# Patient Record
Sex: Female | Born: 1995
Health system: Southern US, Community
[De-identification: ages and names within clinical notes are randomized; demographics above are authoritative.]

## PROBLEM LIST (undated history)

## (undated) DIAGNOSIS — R011 Cardiac murmur, unspecified: Secondary | ICD-10-CM

## (undated) DIAGNOSIS — R109 Unspecified abdominal pain: Secondary | ICD-10-CM

## (undated) DIAGNOSIS — T1490XA Injury, unspecified, initial encounter: Secondary | ICD-10-CM

## (undated) DIAGNOSIS — F913 Oppositional defiant disorder: Secondary | ICD-10-CM

## (undated) DIAGNOSIS — F909 Attention-deficit hyperactivity disorder, unspecified type: Secondary | ICD-10-CM

## (undated) DIAGNOSIS — N390 Urinary tract infection, site not specified: Secondary | ICD-10-CM

## (undated) DIAGNOSIS — H9325 Central auditory processing disorder: Secondary | ICD-10-CM

## (undated) DIAGNOSIS — F419 Anxiety disorder, unspecified: Secondary | ICD-10-CM

## (undated) DIAGNOSIS — T7412XA Child physical abuse, confirmed, initial encounter: Secondary | ICD-10-CM

## (undated) DIAGNOSIS — E7800 Pure hypercholesterolemia, unspecified: Secondary | ICD-10-CM

## (undated) DIAGNOSIS — F840 Autistic disorder: Secondary | ICD-10-CM

## (undated) DIAGNOSIS — E785 Hyperlipidemia, unspecified: Secondary | ICD-10-CM

## (undated) DIAGNOSIS — L409 Psoriasis, unspecified: Secondary | ICD-10-CM

## (undated) DIAGNOSIS — R51 Headache: Secondary | ICD-10-CM

## (undated) HISTORY — DX: Injury, unspecified, initial encounter: T14.90XA

## (undated) HISTORY — DX: Headache: R51

## (undated) HISTORY — DX: Unspecified abdominal pain: R10.9

## (undated) HISTORY — DX: Autistic disorder: F84.0

## (undated) HISTORY — DX: Psoriasis, unspecified: L40.9

## (undated) HISTORY — DX: Child physical abuse, confirmed, initial encounter: T74.12XA

## (undated) HISTORY — DX: Oppositional defiant disorder: F91.3

## (undated) HISTORY — DX: Cardiac murmur, unspecified: R01.1

## (undated) HISTORY — DX: Urinary tract infection, site not specified: N39.0

## (undated) HISTORY — PX: OTHER SURGICAL HISTORY: SHX169

## (undated) HISTORY — DX: Anxiety disorder, unspecified: F41.9

## (undated) HISTORY — DX: Central auditory processing disorder: H93.25

## (undated) HISTORY — DX: Attention-deficit hyperactivity disorder, unspecified type: F90.9

## (undated) HISTORY — DX: Pure hypercholesterolemia, unspecified: E78.00

---
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
## 2016-01-27 ENCOUNTER — Telehealth: Payer: Self-pay | Admitting: *Deleted

## 2016-01-27 NOTE — Telephone Encounter (Signed)
Spoke to guardian, cathy, she stated pt was declared incompetent and  [employer] has guardianship.  Barnie AldermanMelissa Price is her SW , I called and LMVM for her , Efraim KaufmannMelissa to call me back to reschedule pts appt.  I cancelled the appt for 02-02-16 at 1630.

## 2016-02-02 ENCOUNTER — Ambulatory Visit: Payer: Medicaid Other | Admitting: Nurse Practitioner

## 2016-02-16 ENCOUNTER — Encounter: Payer: Self-pay | Admitting: Nurse Practitioner

## 2016-02-16 ENCOUNTER — Ambulatory Visit (INDEPENDENT_AMBULATORY_CARE_PROVIDER_SITE_OTHER): Payer: Medicaid Other | Admitting: Nurse Practitioner

## 2016-02-16 VITALS — BP 90/62 | HR 87 | Ht 60.0 in | Wt 124.2 lb

## 2016-02-16 DIAGNOSIS — G43709 Chronic migraine without aura, not intractable, without status migrainosus: Secondary | ICD-10-CM

## 2016-02-16 DIAGNOSIS — IMO0002 Reserved for concepts with insufficient information to code with codable children: Secondary | ICD-10-CM
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
## 2016-02-16 NOTE — Patient Instructions (Signed)
Headaches well-controlled  given information on foods that cause migraines  Follow-up when necessary only

## 2016-02-17 NOTE — Progress Notes (Signed)
I have reviewed and agreed above plan. 

## 2016-11-27 ENCOUNTER — Ambulatory Visit (INDEPENDENT_AMBULATORY_CARE_PROVIDER_SITE_OTHER): Payer: Medicaid Other | Admitting: Neurology

## 2016-11-27 VITALS — BP 90/48 | HR 88 | Ht 63.5 in | Wt 137.0 lb

## 2016-11-27 DIAGNOSIS — IMO0002 Reserved for concepts with insufficient information to code with codable children: Secondary | ICD-10-CM

## 2016-11-27 DIAGNOSIS — G43709 Chronic migraine without aura, not intractable, without status migrainosus: Secondary | ICD-10-CM | POA: Diagnosis not present

## 2016-11-27 DIAGNOSIS — R4689 Other symptoms and signs involving appearance and behavior: Secondary | ICD-10-CM

## 2016-11-27 DIAGNOSIS — R4189 Other symptoms and signs involving cognitive functions and awareness: Secondary | ICD-10-CM | POA: Diagnosis not present

## 2016-11-27 MED ORDER — GABAPENTIN 100 MG PO CAPS: ORAL_CAPSULE | ORAL | 6 refills | Status: DC

## 2016-11-27 MED ORDER — RIZATRIPTAN BENZOATE 5 MG PO TABS: 5.0000 mg | ORAL_TABLET | ORAL | 6 refills | Status: DC | PRN
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
## 2016-11-27 NOTE — Patient Instructions (Signed)
1. Start gabapentin 100mg : Take 1 capsule every night 2. Take Maxalt 5mg  at onset of migraine, do not take more than 2-3 a week 3. Schedule Neuropsychological evaluation with Dr. Alinda DoomsBailar 4. Follow-up in 4-5 months, call for any changes

## 2016-12-01 ENCOUNTER — Encounter: Payer: Self-pay | Admitting: Neurology

## 2016-12-26 ENCOUNTER — Encounter: Payer: Self-pay | Admitting: Psychology

## 2017-02-27 ENCOUNTER — Ambulatory Visit (INDEPENDENT_AMBULATORY_CARE_PROVIDER_SITE_OTHER): Payer: Medicaid Other | Admitting: Psychology

## 2017-02-27 ENCOUNTER — Encounter: Payer: Self-pay | Admitting: Psychology

## 2017-02-27 DIAGNOSIS — F840 Autistic disorder: Secondary | ICD-10-CM | POA: Diagnosis not present

## 2017-02-27 DIAGNOSIS — R413 Other amnesia: Secondary | ICD-10-CM | POA: Diagnosis not present
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
## 2017-03-06 ENCOUNTER — Ambulatory Visit (INDEPENDENT_AMBULATORY_CARE_PROVIDER_SITE_OTHER): Payer: Medicaid Other | Admitting: Psychology

## 2017-03-06 DIAGNOSIS — R413 Other amnesia: Secondary | ICD-10-CM | POA: Diagnosis not present

## 2017-03-06 DIAGNOSIS — F840 Autistic disorder: Secondary | ICD-10-CM

## 2017-03-06 DIAGNOSIS — R4189 Other symptoms and signs involving cognitive functions and awareness: Secondary | ICD-10-CM

## 2017-03-06 DIAGNOSIS — R4689 Other symptoms and signs involving appearance and behavior: Secondary | ICD-10-CM

## 2017-03-06 NOTE — Progress Notes (Signed)
   Neuropsychology Note  Jennifer Higgins returned today for 2 hours of neuropsychological testing with technician, Wallace Keller, BS, under the supervision of Dr. Elvis Coil. The patient did appear very tired and agitated at the end of the testing session, per behavioral observation or via self-report to the technician. Patient did take 2 rest breaks.  Jennifer Higgins will return within 2 weeks for a feedback session with Dr. Alinda Dooms at which time her test performances, clinical impressions and treatment recommendations will be reviewed in detail. The patient understands she can contact our office should she require our assistance before this time.  Full report to follow.
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
## 2017-03-13 ENCOUNTER — Encounter: Payer: Self-pay | Admitting: Psychology

## 2017-03-13 ENCOUNTER — Ambulatory Visit (INDEPENDENT_AMBULATORY_CARE_PROVIDER_SITE_OTHER): Payer: Medicaid Other | Admitting: Psychology

## 2017-03-13 DIAGNOSIS — F840 Autistic disorder: Secondary | ICD-10-CM | POA: Diagnosis not present

## 2017-10-24 ENCOUNTER — Other Ambulatory Visit: Payer: Self-pay | Admitting: Neurology

## 2017-10-24 DIAGNOSIS — G43709 Chronic migraine without aura, not intractable, without status migrainosus: Secondary | ICD-10-CM

## 2017-10-24 DIAGNOSIS — IMO0002 Reserved for concepts with insufficient information to code with codable children: Secondary | ICD-10-CM

## 2017-11-26 ENCOUNTER — Other Ambulatory Visit: Payer: Self-pay | Admitting: Neurology

## 2017-11-26 DIAGNOSIS — G43709 Chronic migraine without aura, not intractable, without status migrainosus: Secondary | ICD-10-CM

## 2017-11-26 DIAGNOSIS — IMO0002 Reserved for concepts with insufficient information to code with codable children: Secondary | ICD-10-CM

## 2017-12-01 ENCOUNTER — Other Ambulatory Visit: Payer: Self-pay | Admitting: Neurology

## 2017-12-01 DIAGNOSIS — IMO0002 Reserved for concepts with insufficient information to code with codable children: Secondary | ICD-10-CM

## 2017-12-01 DIAGNOSIS — G43709 Chronic migraine without aura, not intractable, without status migrainosus: Secondary | ICD-10-CM

## 2018-07-02 ENCOUNTER — Ambulatory Visit: Payer: Self-pay | Admitting: Cardiovascular Disease

## 2018-07-03 ENCOUNTER — Ambulatory Visit (INDEPENDENT_AMBULATORY_CARE_PROVIDER_SITE_OTHER): Payer: Medicare Other | Admitting: Cardiology

## 2018-07-03 ENCOUNTER — Encounter: Payer: Self-pay | Admitting: Cardiology

## 2018-07-03 VITALS — BP 96/62 | HR 93 | Ht 63.5 in | Wt 142.0 lb

## 2018-07-03 DIAGNOSIS — R079 Chest pain, unspecified: Secondary | ICD-10-CM

## 2018-07-03 DIAGNOSIS — E782 Mixed hyperlipidemia: Secondary | ICD-10-CM

## 2018-07-03 MED ORDER — ATORVASTATIN CALCIUM 20 MG PO TABS: 20.0000 mg | ORAL_TABLET | Freq: Every day | ORAL | 3 refills | Status: DC

## 2018-07-03 NOTE — Patient Instructions (Signed)
Medication Instructions:  Start Lipitor 20 mg daily   Labwork: I will request labs & office notes   Testing/Procedures: none  Follow-Up: Your physician wants you to follow-up in: 6 months .  You will receive a reminder letter in the mail two months in advance. If you don't receive a letter, please call our office to schedule the follow-up appointment.   Any Other Special Instructions Will Be Listed Below (If Applicable).     If you need a refill on your cardiac medications before your next appointment, please call your pharmacy.
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
## 2018-07-30 ENCOUNTER — Other Ambulatory Visit: Payer: Self-pay | Admitting: Neurology

## 2018-08-13 DIAGNOSIS — K5900 Constipation, unspecified: Secondary | ICD-10-CM | POA: Diagnosis not present

## 2018-08-13 DIAGNOSIS — E559 Vitamin D deficiency, unspecified: Secondary | ICD-10-CM | POA: Diagnosis not present

## 2018-08-13 DIAGNOSIS — E785 Hyperlipidemia, unspecified: Secondary | ICD-10-CM | POA: Diagnosis not present

## 2018-08-13 DIAGNOSIS — R079 Chest pain, unspecified: Secondary | ICD-10-CM | POA: Diagnosis not present

## 2018-08-14 DIAGNOSIS — E559 Vitamin D deficiency, unspecified: Secondary | ICD-10-CM | POA: Diagnosis not present

## 2018-08-14 DIAGNOSIS — E785 Hyperlipidemia, unspecified: Secondary | ICD-10-CM | POA: Diagnosis not present

## 2018-08-26 DIAGNOSIS — K5900 Constipation, unspecified: Secondary | ICD-10-CM | POA: Diagnosis not present

## 2018-09-04 ENCOUNTER — Ambulatory Visit (INDEPENDENT_AMBULATORY_CARE_PROVIDER_SITE_OTHER): Payer: Self-pay | Admitting: Internal Medicine

## 2018-09-09 ENCOUNTER — Other Ambulatory Visit: Payer: Self-pay | Admitting: Neurology

## 2018-09-09 DIAGNOSIS — IMO0002 Reserved for concepts with insufficient information to code with codable children: Secondary | ICD-10-CM

## 2018-09-09 DIAGNOSIS — G43709 Chronic migraine without aura, not intractable, without status migrainosus: Secondary | ICD-10-CM

## 2018-10-08 ENCOUNTER — Other Ambulatory Visit: Payer: Self-pay | Admitting: Neurology

## 2018-10-08 DIAGNOSIS — IMO0002 Reserved for concepts with insufficient information to code with codable children: Secondary | ICD-10-CM

## 2018-10-08 DIAGNOSIS — G43709 Chronic migraine without aura, not intractable, without status migrainosus: Secondary | ICD-10-CM

## 2018-10-10 ENCOUNTER — Telehealth: Payer: Self-pay | Admitting: Adult Health
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
## 2018-10-11 NOTE — Telephone Encounter (Signed)
Spoke with [name] from DSS. Pt will need an appt for pap and STD screen. Pt will need a support person with her at appt. Call transferred to front desk for appt. JSY

## 2018-10-16 ENCOUNTER — Other Ambulatory Visit (HOSPITAL_COMMUNITY)
Admission: RE | Admit: 2018-10-16 | Discharge: 2018-10-16 | Disposition: A | Discharge status: Home / Self Care | Payer: Medicare Other | Source: Ambulatory Visit | Attending: Adult Health | Admitting: Adult Health

## 2018-10-16 ENCOUNTER — Other Ambulatory Visit: Payer: Self-pay

## 2018-10-16 ENCOUNTER — Ambulatory Visit (INDEPENDENT_AMBULATORY_CARE_PROVIDER_SITE_OTHER): Payer: Medicare Other | Admitting: Adult Health

## 2018-10-16 ENCOUNTER — Encounter: Payer: Self-pay | Admitting: Adult Health

## 2018-10-16 VITALS — BP 107/76 | HR 97 | Ht 63.5 in | Wt 147.0 lb

## 2018-10-16 DIAGNOSIS — Z01419 Encounter for gynecological examination (general) (routine) without abnormal findings: Secondary | ICD-10-CM

## 2018-10-16 DIAGNOSIS — Z30011 Encounter for initial prescription of contraceptive pills: Secondary | ICD-10-CM

## 2018-10-16 DIAGNOSIS — Z124 Encounter for screening for malignant neoplasm of cervix: Secondary | ICD-10-CM | POA: Diagnosis not present

## 2018-10-16 DIAGNOSIS — Z0001 Encounter for general adult medical examination with abnormal findings: Secondary | ICD-10-CM

## 2018-10-16 DIAGNOSIS — N946 Dysmenorrhea, unspecified: Secondary | ICD-10-CM

## 2018-10-16 DIAGNOSIS — N920 Excessive and frequent menstruation with regular cycle: Secondary | ICD-10-CM | POA: Insufficient documentation

## 2018-10-16 HISTORY — DX: Encounter for general adult medical examination with abnormal findings: Z00.01

## 2018-10-16 MED ORDER — LEVONORGEST-ETH ESTRAD 91-DAY 0.15-0.03 &0.01 MG PO TABS: 1.0000 | ORAL_TABLET | Freq: Every day | ORAL | 4 refills | Status: DC
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
## 2018-10-18 LAB — CYTOLOGY - PAP
Adequacy: ABSENT
Chlamydia: NEGATIVE
Diagnosis: NEGATIVE
Neisseria Gonorrhea: NEGATIVE

## 2018-11-06 DIAGNOSIS — K5900 Constipation, unspecified: Secondary | ICD-10-CM | POA: Diagnosis not present

## 2018-11-06 DIAGNOSIS — F330 Major depressive disorder, recurrent, mild: Secondary | ICD-10-CM | POA: Diagnosis not present

## 2018-11-06 DIAGNOSIS — E785 Hyperlipidemia, unspecified: Secondary | ICD-10-CM | POA: Diagnosis not present

## 2018-11-13 ENCOUNTER — Ambulatory Visit (INDEPENDENT_AMBULATORY_CARE_PROVIDER_SITE_OTHER): Payer: Self-pay | Admitting: Nurse Practitioner

## 2018-12-05 ENCOUNTER — Telehealth: Payer: Self-pay | Admitting: Cardiology

## 2018-12-05 NOTE — Telephone Encounter (Signed)
[name] case worker [name] called to schedule pt her next f/u apt.[name] stated that Dr. Anastasio Champion changed the pt's Lipitor dose due to lab work results.   I have scanned those in from Dr. Anastasio Champion and forwarded them to Dr. Harl Bowie.

## 2018-12-11 ENCOUNTER — Encounter: Payer: Self-pay | Admitting: Neurology

## 2018-12-11 ENCOUNTER — Other Ambulatory Visit: Payer: Self-pay

## 2018-12-11 ENCOUNTER — Ambulatory Visit (INDEPENDENT_AMBULATORY_CARE_PROVIDER_SITE_OTHER): Payer: Medicare Other | Admitting: Neurology

## 2018-12-11 DIAGNOSIS — G43709 Chronic migraine without aura, not intractable, without status migrainosus: Secondary | ICD-10-CM

## 2018-12-11 DIAGNOSIS — IMO0002 Reserved for concepts with insufficient information to code with codable children: Secondary | ICD-10-CM

## 2018-12-11 MED ORDER — RIZATRIPTAN BENZOATE 5 MG PO TABS: ORAL_TABLET | ORAL | 11 refills | Status: DC

## 2018-12-11 MED ORDER — GABAPENTIN 100 MG PO CAPS: ORAL_CAPSULE | ORAL | 3 refills | Status: DC
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
## 2018-12-11 NOTE — Patient Instructions (Signed)
1. Restart Gabapentin 100mg : take 1 capsule every night  2. Take Maxalt as needed for headache. Do not take more than 2-3 a week  3. Follow-up in 6-68months, call for any changes

## 2019-01-16 ENCOUNTER — Ambulatory Visit: Payer: Medicare Other | Admitting: Adult Health

## 2019-01-17 ENCOUNTER — Ambulatory Visit: Payer: Medicare Other | Admitting: Women's Health

## 2019-01-20 ENCOUNTER — Telehealth: Payer: Self-pay | Admitting: Women's Health

## 2019-01-20 NOTE — Telephone Encounter (Signed)

## 2019-01-21 ENCOUNTER — Other Ambulatory Visit: Payer: Self-pay

## 2019-01-21 ENCOUNTER — Ambulatory Visit (INDEPENDENT_AMBULATORY_CARE_PROVIDER_SITE_OTHER): Payer: Medicare Other | Admitting: Women's Health

## 2019-01-21 ENCOUNTER — Encounter: Payer: Self-pay | Admitting: Women's Health

## 2019-01-21 VITALS — Ht 65.0 in

## 2019-01-21 DIAGNOSIS — N920 Excessive and frequent menstruation with regular cycle: Secondary | ICD-10-CM | POA: Diagnosis not present

## 2019-01-21 DIAGNOSIS — Z3041 Encounter for surveillance of contraceptive pills: Secondary | ICD-10-CM

## 2019-01-21 DIAGNOSIS — N946 Dysmenorrhea, unspecified: Secondary | ICD-10-CM | POA: Diagnosis not present

## 2019-01-21 NOTE — Progress Notes (Signed)
   [place] VIRTUAL GYN VISIT ENCOUNTER NOTE Patient name: Jennifer Higgins MRN [id number]  Date of birth: 1995/08/13  I connected with patient on 01/21/19 at 11:30 AM EDT by webex and verified that I am speaking with the correct person using two identifiers.  Due to COVID-19 recommendations, pt is not currently in the office.    I discussed the limitations, risks, security and privacy concerns of performing an evaluation and management service by telephone and the availability of in person appointments. I also discussed with the patient that there may be a patient responsible charge related to this service. The patient expressed understanding and agreed to proceed.   Chief Complaint:   Follow-up (on birth control)  History of Present Illness:   Jennifer Higgins is a 23 y.o. G0P0000 Caucasian female being evaluated today for f/u on COCs.  Rx'd levonorgestrel-EE 0.15-0.03 & 0.01mg  COCs on 10/16/18 by JAG for heavy painful periods. Periods have improved some, still bad cramps, wants to give it few more months to see if it will help.   No LMP recorded. The current method of family planning is OCP (estrogen/progesterone).  Last pap 10/16/18. Results were:  normal Review of Systems:   Pertinent items are noted in HPI Denies fever/chills, dizziness, headaches, visual disturbances, fatigue, shortness of breath, chest pain, abdominal pain, vomiting, abnormal vaginal discharge/itching/odor/irritation, problems with periods, bowel movements, urination, or intercourse unless otherwise stated above.  Pertinent History Reviewed:  Reviewed past medical,surgical, social, obstetrical and family history.  Reviewed problem list, medications and allergies. Physical Assessment:   Vitals:   01/21/19 1059  Height: 5\' 5"  (1.651 m)  Body mass index is 25.63 kg/m.       Physical Examination:   General:  Alert, oriented and cooperative.   Mental Status: Normal mood and affect perceived. Normal judgment and  thought content.  Physical exam deferred due to nature of the encounter  No results found for this or any previous visit (from the past 24 hour(s)).  Assessment & Plan:  1) Contraception f/u> for heavy/painful periods, give COCs another 63mths to see if dysmenorrhea improves, f/u 65mths  Meds: No orders of the defined types were placed in this encounter.   No orders of the defined types were placed in this encounter.   I discussed the assessment and treatment plan with the patient. The patient was provided an opportunity to ask questions and all were answered. The patient agreed with the plan and demonstrated an understanding of the instructions.   The patient was advised to call back or seek an in-person evaluation/go to the ED if the symptoms worsen or if the condition fails to improve as anticipated.  I provided 10 minutes of non-face-to-face time during this encounter.   Return in about 3 months (around 04/23/2019) for F/U, MyChart Video.  [place], [facility] 01/21/2019 11:21 AM

## 2019-02-20 ENCOUNTER — Telehealth (INDEPENDENT_AMBULATORY_CARE_PROVIDER_SITE_OTHER): Payer: Medicare Other | Admitting: Cardiology

## 2019-02-20 ENCOUNTER — Encounter: Payer: Self-pay | Admitting: Cardiology

## 2019-02-20 ENCOUNTER — Telehealth: Payer: Self-pay | Admitting: Licensed Clinical Social Worker

## 2019-02-20 VITALS — Ht 62.0 in | Wt 159.0 lb

## 2019-02-20 DIAGNOSIS — E782 Mixed hyperlipidemia: Secondary | ICD-10-CM | POA: Diagnosis not present

## 2019-02-20 NOTE — Progress Notes (Signed)
Please send last labs from [facility] panel. THX!
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
## 2019-02-20 NOTE — Telephone Encounter (Signed)
CSW referred to assist patient with obtaining a BP cuff. CSW contacted patient's legal guardian at [place] to inform cuff will be delivered to home. Guardian grateful for support and assistance. CSW available as needed. Raquel Sarna, [place], [place]

## 2019-02-20 NOTE — Progress Notes (Signed)
Please send labs-I hope this is not a repeat!

## 2019-02-20 NOTE — Patient Instructions (Signed)

## 2019-02-24 ENCOUNTER — Ambulatory Visit (INDEPENDENT_AMBULATORY_CARE_PROVIDER_SITE_OTHER): Payer: Medicare Other | Admitting: Internal Medicine

## 2019-02-24 ENCOUNTER — Encounter (INDEPENDENT_AMBULATORY_CARE_PROVIDER_SITE_OTHER): Payer: Self-pay | Admitting: Internal Medicine

## 2019-02-24 ENCOUNTER — Other Ambulatory Visit: Payer: Self-pay

## 2019-02-24 VITALS — BP 120/80 | HR 72 | Ht 64.0 in | Wt 164.6 lb

## 2019-02-24 DIAGNOSIS — E785 Hyperlipidemia, unspecified: Secondary | ICD-10-CM | POA: Insufficient documentation

## 2019-02-24 DIAGNOSIS — F902 Attention-deficit hyperactivity disorder, combined type: Secondary | ICD-10-CM

## 2019-02-24 DIAGNOSIS — E559 Vitamin D deficiency, unspecified: Secondary | ICD-10-CM | POA: Diagnosis not present

## 2019-02-24 DIAGNOSIS — E782 Mixed hyperlipidemia: Secondary | ICD-10-CM

## 2019-02-24 HISTORY — DX: Hyperlipidemia, unspecified: E78.5

## 2019-02-24 NOTE — Patient Instructions (Signed)
[facility] Dietary Recommendations for Weight Loss What to Avoid . Avoid added sugars o Often added sugar can be found in processed foods such as many condiments, dry cereals, cakes, cookies, chips, crisps, crackers, candies, sweetened drinks, etc.  o Read labels and AVOID/DECREASE use of foods with the following in their ingredient list: Sugar, fructose, high fructose corn syrup, sucrose, glucose, maltose, dextrose, molasses, cane sugar, brown sugar, any type of syrup, agave nectar, etc.   . Avoid snacking in between meals . Avoid foods made with flour o If you are going to eat food made with flour, choose those made with whole-grains; and, minimize your consumption as much as is tolerable . Avoid processed foods o These foods are generally stocked in the middle of the grocery store. Focus on shopping on the perimeter of the grocery.  What to Include . Vegetables o GREEN LEAFY VEGETABLES: Kale, spinach, mustard greens, collard greens, cabbage, broccoli, etc. o OTHER: Asparagus, cauliflower, eggplant, carrots, peas, Brussel sprouts, tomatoes, bell peppers, zucchini, beets, cucumbers, etc. . Grains, seeds, and legumes o Beans: kidney beans, black eyed peas, garbanzo beans, black beans, pinto beans, etc. o Whole, unrefined grains: brown rice, barley, bulgur, oatmeal, etc. . Healthy fats  o Avoid highly processed fats such as vegetable oil o Examples of healthy fats: avocado, olives, virgin olive oil, dark chocolate (?72% Cocoa), nuts (peanuts, almonds, walnuts, cashews, pecans, etc.) . Low - Moderate Intake of Animal Sources of Protein o Meat sources: chicken, turkey, salmon, tuna. Limit to 4 ounces of meat at one time. o Consider limiting dairy sources, but when choosing dairy focus on: PLAIN Greek yogurt, cottage cheese, high-protein milk . Fruit o Choose berries  When to Eat . Intermittent Fasting: o Choosing not to eat for a specific time period, but DO FOCUS ON HYDRATION  when fasting o Multiple Techniques: - Time Restricted Eating: eat 3 meals in a day, each meal lasting no more than 60 minutes, no snacks between meals - 16-18 hour fast: fast for 16 to 18 hours up to 7 days a week. Often suggested to start with 2-3 nonconsecutive days per week.  . Remember the time you sleep is counted as fasting.  . Examples of eating schedule: Fast from 7:00pm-11:00am. Eat between 11:00am-7:00pm.  - 24-hour fast: fast for 24 hours up to every other day. Often suggested to start with 1 day per week . Remember the time you sleep is counted as fasting . Examples of eating schedule:  o Eating day: eat 2-3 meals on your eating day. If doing 2 meals, each meal should last no more than 90 minutes. If doing 3 meals, each meal should last no more than 60 minutes. Finish last meal by 7:00pm. o Fasting day: Fast until 7:00pm.  o IF YOU FEEL UNWELL FOR ANY REASON/IN ANY WAY WHEN FASTING, STOP FASTING BY EATING A NUTRITIOUS SNACK OR LIGHT MEAL o ALWAYS FOCUS ON HYDRATION DURING FASTS - Acceptable Hydration sources: water, broths, tea/coffee (black tea/coffee is best but using a small amount of whole-fat dairy products in coffee/tea is acceptable).  - Poor Hydration Sources: anything with sugar or artificial sweeteners added to it  These recommendations have been developed for patients that are actively receiving medical care from either Dr.  or Sarah Gray, DNP, NP-C at  [facility]. These recommendations are developed for patients with specific medical conditions and are not meant to be distributed or used by others that are not actively receiving care from either provider listed   above at  [facility]. It is not appropriate to participate in the above eating plans without proper medical supervision.   Reference: Fung, J. The obesity code. [place]: [place]; 2016.   
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
## 2019-02-25 ENCOUNTER — Encounter (INDEPENDENT_AMBULATORY_CARE_PROVIDER_SITE_OTHER): Payer: Self-pay | Admitting: Internal Medicine

## 2019-02-25 LAB — COMPLETE METABOLIC PANEL WITH GFR
AG Ratio: 1.4 (calc) (ref 1.0–2.5)
ALT: 21 U/L (ref 6–29)
AST: 29 U/L (ref 10–30)
Albumin: 4.1 g/dL (ref 3.6–5.1)
Alkaline phosphatase (APISO): 51 U/L (ref 31–125)
BUN: 9 mg/dL (ref 7–25)
CO2: 22 mmol/L (ref 20–32)
Calcium: 9.2 mg/dL (ref 8.6–10.2)
Chloride: 105 mmol/L (ref 98–110)
Creat: 0.8 mg/dL (ref 0.50–1.10)
GFR, Est African American: 122 mL/min/{1.73_m2} (ref >=60)
GFR, Est Non African American: 105 mL/min/{1.73_m2} (ref >=60)
Globulin: 2.9 g/dL (calc) (ref 1.9–3.7)
Glucose, Bld: 84 mg/dL (ref 65–99)
Potassium: 4.4 mmol/L (ref 3.5–5.3)
Sodium: 139 mmol/L (ref 135–146)
Total Bilirubin: 0.3 mg/dL (ref 0.2–1.2)
Total Protein: 7 g/dL (ref 6.1–8.1)

## 2019-02-25 LAB — LIPID PANEL
Cholesterol: 223 mg/dL — ABNORMAL HIGH (ref <200)
HDL: 45 mg/dL — ABNORMAL LOW (ref >=50)
LDL Cholesterol (Calc): 133 mg/dL (calc) — ABNORMAL HIGH
Non-HDL Cholesterol (Calc): 178 mg/dL (calc) — ABNORMAL HIGH (ref <130)
Total CHOL/HDL Ratio: 5 (calc) — ABNORMAL HIGH (ref <5.0)
Triglycerides: 312 mg/dL — ABNORMAL HIGH (ref <150)

## 2019-02-25 LAB — VITAMIN D 25 HYDROXY (VIT D DEFICIENCY, FRACTURES): Vit D, 25-Hydroxy: 25 ng/mL — ABNORMAL LOW (ref 30–100)

## 2019-02-25 NOTE — Progress Notes (Signed)
Please see this patient's cholesterol numbers.  I think they are reasonable to continue with the same dose of Lipitor.  She has not been very good with diet so I encouraged her to do this.

## 2019-03-11 ENCOUNTER — Other Ambulatory Visit (INDEPENDENT_AMBULATORY_CARE_PROVIDER_SITE_OTHER): Payer: Self-pay | Admitting: Nurse Practitioner

## 2019-03-17 ENCOUNTER — Telehealth (INDEPENDENT_AMBULATORY_CARE_PROVIDER_SITE_OTHER): Payer: Self-pay | Admitting: Internal Medicine

## 2019-03-17 ENCOUNTER — Other Ambulatory Visit (INDEPENDENT_AMBULATORY_CARE_PROVIDER_SITE_OTHER): Payer: Self-pay | Admitting: Internal Medicine

## 2019-03-17 MED ORDER — CLONIDINE HCL 0.1 MG PO TABS: 0.1000 mg | ORAL_TABLET | Freq: Every day | ORAL | 1 refills | Status: DC

## 2019-03-18 NOTE — Telephone Encounter (Signed)
done

## 2019-04-08 ENCOUNTER — Encounter (INDEPENDENT_AMBULATORY_CARE_PROVIDER_SITE_OTHER): Payer: Self-pay | Admitting: Internal Medicine

## 2019-04-08 ENCOUNTER — Ambulatory Visit (INDEPENDENT_AMBULATORY_CARE_PROVIDER_SITE_OTHER): Payer: Medicare Other | Admitting: Internal Medicine

## 2019-04-08 ENCOUNTER — Other Ambulatory Visit: Payer: Self-pay

## 2019-04-08 VITALS — BP 110/70 | HR 72 | Ht 64.0 in | Wt 162.0 lb

## 2019-04-08 DIAGNOSIS — M79601 Pain in right arm: Secondary | ICD-10-CM | POA: Diagnosis not present

## 2019-04-08 DIAGNOSIS — Z23 Encounter for immunization: Secondary | ICD-10-CM
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
## 2019-04-22 ENCOUNTER — Telehealth: Payer: Self-pay | Admitting: Medical

## 2019-04-22 NOTE — Telephone Encounter (Signed)
Unable to reach pt - VM full

## 2019-04-23 ENCOUNTER — Ambulatory Visit: Payer: Medicare Other | Admitting: Medical

## 2019-04-29 ENCOUNTER — Telehealth: Payer: Self-pay | Admitting: Advanced Practice Midwife

## 2019-04-29 NOTE — Telephone Encounter (Signed)
Tried to reach the patient to remind her of her appointment/restrictions, mb not set up. 

## 2019-04-30 ENCOUNTER — Encounter: Payer: Self-pay | Admitting: Advanced Practice Midwife

## 2019-04-30 ENCOUNTER — Other Ambulatory Visit: Payer: Self-pay

## 2019-04-30 ENCOUNTER — Ambulatory Visit (INDEPENDENT_AMBULATORY_CARE_PROVIDER_SITE_OTHER): Payer: Medicare Other | Admitting: Advanced Practice Midwife

## 2019-04-30 VITALS — BP 117/78 | HR 124 | Ht 65.0 in | Wt 165.0 lb

## 2019-04-30 DIAGNOSIS — Z3041 Encounter for surveillance of contraceptive pills: Secondary | ICD-10-CM | POA: Diagnosis not present

## 2019-04-30 MED ORDER — LEVONORGEST-ETH ESTRAD 91-DAY 0.15-0.03 &0.01 MG PO TABS: 1.0000 | ORAL_TABLET | Freq: Every day | ORAL | 6 refills | Status: DC

## 2019-04-30 NOTE — Progress Notes (Signed)
   GYN VISIT Patient name: Jennifer Higgins MRN [id number]  Date of birth: [birth year] Chief Complaint:   Follow-up (on birth control)  History of Present Illness:   Jennifer Higgins is a 23 y.o. G0P0000 Caucasian female being seen today for f/u on OCPs. Her guardian, [name], is wit her. Just starting 3rd pack; states migranes are much improved and desires to continue on OCPs. Has had BTB x 1.  No LMP recorded. (Menstrual status: Oral contraceptives). The current method of family planning is OCP (estrogen/progesterone).  Last pap 09/2018. Results were:  normal Review of Systems:   Pertinent items are noted in HPI Denies fever/chills, dizziness, headaches, visual disturbances, fatigue, shortness of breath, chest pain, abdominal pain, vomiting, abnormal vaginal discharge/itching/odor/irritation, problems with periods, bowel movements, urination, or intercourse unless otherwise stated above.  Pertinent History Reviewed:  Reviewed past medical,surgical, social, obstetrical and family history.  Reviewed problem list, medications and allergies. Physical Assessment:   Vitals:   04/30/19 1350  BP: 117/78  Pulse: (!) 124  Weight: 165 lb (74.8 kg)  Height: 5\' 5"  (1.651 m)  Body mass index is 27.46 kg/m.       Physical Examination:   General appearance: alert, well appearing, and in no distress  Mental status: alert, oriented to person, place, and time  Skin: warm & dry   Cardiovascular: normal heart rate noted  Respiratory: normal respiratory effort, no distress  Abdomen: soft, non-tender   Pelvic: examination not indicated  Extremities: no edema   Chaperone: n/a    No results found for this or any previous visit (from the past 24 hour(s)).  Assessment & Plan:  1) F/U in new-start OCPs> doing well, improved migranes, dysmenorrhea improved- Amethia refilled until June   Meds:  Meds ordered this encounter  Medications  . Levonorgestrel-Ethinyl Estradiol (AMETHIA) 0.15-0.03  &0.01 MG tablet    Sig: Take 1 tablet by mouth daily.    Dispense:  1 Package    Refill:  6    No orders of the defined types were placed in this encounter.   Return in about 24 weeks (around 10/15/2019) for with Anderson Malta, [place] 04/30/2019 2:17 PM

## 2019-05-30 HISTORY — PX: WISDOM TOOTH EXTRACTION: SHX21

## 2019-06-18 ENCOUNTER — Other Ambulatory Visit (INDEPENDENT_AMBULATORY_CARE_PROVIDER_SITE_OTHER): Payer: Self-pay | Admitting: Internal Medicine

## 2019-06-23 ENCOUNTER — Telehealth (INDEPENDENT_AMBULATORY_CARE_PROVIDER_SITE_OTHER): Payer: Self-pay

## 2019-06-23 NOTE — Telephone Encounter (Signed)
CASE WORKER IS CONCERN THAT SHE IS PICKING UP WEIGHT. SHE HAS POOR EATING HABITS , AND IS WANTING TO SEEN IF WE CAN SCHEDULE A VISIT. SO WE  FIND OUT WITH  MEDICATIONS SHE IS NOT TAKING.

## 2019-06-30 ENCOUNTER — Other Ambulatory Visit: Payer: Self-pay

## 2019-06-30 ENCOUNTER — Encounter (INDEPENDENT_AMBULATORY_CARE_PROVIDER_SITE_OTHER): Payer: Self-pay | Admitting: Nurse Practitioner

## 2019-06-30 ENCOUNTER — Ambulatory Visit (INDEPENDENT_AMBULATORY_CARE_PROVIDER_SITE_OTHER): Payer: Medicare Other | Admitting: Nurse Practitioner

## 2019-06-30 VITALS — BP 112/80 | HR 100 | Temp 100.0°F | Resp 15 | Ht 65.0 in | Wt 171.0 lb

## 2019-06-30 DIAGNOSIS — E559 Vitamin D deficiency, unspecified: Secondary | ICD-10-CM | POA: Diagnosis not present

## 2019-06-30 DIAGNOSIS — H6120 Impacted cerumen, unspecified ear: Secondary | ICD-10-CM | POA: Insufficient documentation

## 2019-06-30 DIAGNOSIS — Z139 Encounter for screening, unspecified: Secondary | ICD-10-CM | POA: Diagnosis not present

## 2019-06-30 DIAGNOSIS — H6123 Impacted cerumen, bilateral: Secondary | ICD-10-CM

## 2019-06-30 DIAGNOSIS — Z0001 Encounter for general adult medical examination with abnormal findings: Secondary | ICD-10-CM | POA: Diagnosis not present

## 2019-06-30 DIAGNOSIS — K0889 Other specified disorders of teeth and supporting structures: Secondary | ICD-10-CM | POA: Diagnosis not present

## 2019-06-30 DIAGNOSIS — R635 Abnormal weight gain: Secondary | ICD-10-CM | POA: Diagnosis not present

## 2019-06-30 DIAGNOSIS — Z1329 Encounter for screening for other suspected endocrine disorder: Secondary | ICD-10-CM | POA: Diagnosis not present

## 2019-06-30 DIAGNOSIS — E785 Hyperlipidemia, unspecified: Secondary | ICD-10-CM

## 2019-06-30 DIAGNOSIS — Z131 Encounter for screening for diabetes mellitus: Secondary | ICD-10-CM | POA: Diagnosis not present

## 2019-06-30 HISTORY — DX: Other specified disorders of teeth and supporting structures: K08.89

## 2019-06-30 NOTE — Assessment & Plan Note (Signed)
I will collect her lipid panel today for further evaluation.

## 2019-06-30 NOTE — Assessment & Plan Note (Signed)
I encouraged patient to follow-up with her dentist today as scheduled.

## 2019-06-30 NOTE — Assessment & Plan Note (Addendum)
We did discuss dietary changes as well as intermittent fasting that she can make to help with weight loss.  She is reluctant to make any changes to her diet as she has very few whole foods that she enjoys.  She is somewhat interested in trying intermittent fasting.  I did give her handout with recommendations for helping with weight loss.  I recommended that she look through this handout and make a goal for herself that she thinks is realistic based on these recommendations.  I told her we would discuss this further at her annual physical exam 2 weeks.  I will also collect all blood work today in preparation for annual physical exam which is scheduled in 2 weeks.  Further recommendations may be made based upon these results.
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
## 2019-06-30 NOTE — Assessment & Plan Note (Signed)
Patient does have bilateral cerumen impaction.  Per patient's legal guardian the patient has tried Debrox drops in the past, but this did not successfully result in clearing the impaction. As opposed to trying to disimpact it today based on the fact that she did not tolerate disimpaction last time she was seen here, I will refer her to ENT for further assistance.

## 2019-06-30 NOTE — Patient Instructions (Signed)
Thank you for choosing [facility] as your medical provider! If you have any questions or concerns regarding your health care, please do not hesitate to call our office.  Please look at the sheet below and determine a realistic goal related to either improving your eating habits, or trying intermittent fasting.  We will discuss this goal at your next office visit.  I am collecting blood work today which we will discuss the results at your next office visit.  Please follow-up with your dentist as well as the ear nose and throat doctor that I am referring you to regarding your tooth pain and earwax.  Please follow-up as scheduled in 2 weeks. We look forward to seeing you again soon!   At [facility] we value your feedback. You may receive a survey about your visit today. Please share your experience as we strive to create trusting relationships with our patients to provide genuine, compassionate, quality care.  We appreciate your understanding and patience as we review any laboratory studies, imaging, and other diagnostic tests that are ordered as we care for you. We do our best to address any and all results in a timely manner. If you do not hear about test results within 1 week, please do not hesitate to contact us. If we referred you to a specialist during your visit or ordered imaging testing, contact the office if you have not been contacted to be scheduled within 1 weeks.  We also encourage the use of MyChart, which contains your medical information for your review as well. If you are not enrolled in this feature, an access code is on this after visit summary for your convenience. Thank you for allowing [place] to be involved in your care.   [facility] Dietary Recommendations for Weight Loss What to Avoid . Avoid added sugars o Often added sugar can be found in processed foods such as many condiments, dry cereals, cakes, cookies, chips, crisps, crackers, candies,  sweetened drinks, etc.  o Read labels and AVOID/DECREASE use of foods with the following in their ingredient list: Sugar, fructose, high fructose corn syrup, sucrose, glucose, maltose, dextrose, molasses, cane sugar, brown sugar, any type of syrup, agave nectar, etc.   . Avoid snacking in between meals . Avoid foods made with flour o If you are going to eat food made with flour, choose those made with whole-grains; and, minimize your consumption as much as is tolerable . Avoid processed foods o These foods are generally stocked in the middle of the grocery store. Focus on shopping on the perimeter of the grocery.  . Avoid Meat  o We recommend following a plant-based diet at [facility]. Thus, we recommend avoiding meat as a general rule. Consider eating beans, legumes, eggs, and/or dairy products for regular protein sources o If you plan on eating meat limit to 4 ounces of meat at a time and choose lean options such as Fish, chicken, [place]. Avoid red meat intake such as pork and/or steak What to Include . Vegetables o GREEN LEAFY VEGETABLES: Kale, spinach, mustard greens, collard greens, cabbage, broccoli, etc. o OTHER: Asparagus, cauliflower, eggplant, carrots, peas, Brussel sprouts, tomatoes, bell peppers, zucchini, beets, cucumbers, etc. . Grains, seeds, and legumes o Beans: kidney beans, black eyed peas, garbanzo beans, black beans, pinto beans, etc. o Whole, unrefined grains: brown rice, barley, bulgur, oatmeal, etc. . Healthy fats  o Avoid highly processed fats such as vegetable oil o Examples of healthy fats: avocado, olives,  virgin olive oil, dark chocolate (?72% Cocoa), nuts (peanuts, almonds, walnuts, cashews, pecans, etc.) . None to Low Intake of Animal Sources of Protein o Meat sources: chicken, [place], salmon, tuna. Limit to 4 ounces of meat at one time. o Consider limiting dairy sources, but when choosing dairy focus on: PLAIN [place] yogurt, cottage cheese, high-protein  milk . Fruit o Choose berries  When to Eat . Intermittent Fasting: o Choosing not to eat for a specific time period, but DO FOCUS ON HYDRATION when fasting o Multiple Techniques: - Time Restricted Eating: eat 3 meals in a day, each meal lasting no more than 60 minutes, no snacks between meals - 16-18 hour fast: fast for 16 to 18 hours up to 7 days a week. Often suggested to start with 2-3 nonconsecutive days per week.  . Remember the time you sleep is counted as fasting.  . Examples of eating schedule: Fast from 7:00pm-11:00am. Eat between 11:00am-7:00pm.  - 24-hour fast: fast for 24 hours up to every other day. Often suggested to start with 1 day per week . Remember the time you sleep is counted as fasting . Examples of eating schedule:  o Eating day: eat 2-3 meals on your eating day. If doing 2 meals, each meal should last no more than 90 minutes. If doing 3 meals, each meal should last no more than 60 minutes. Finish last meal by 7:00pm. o Fasting day: Fast until 7:00pm.  o IF YOU FEEL UNWELL FOR ANY REASON/IN ANY WAY WHEN FASTING, STOP FASTING BY EATING A NUTRITIOUS SNACK OR LIGHT MEAL o ALWAYS FOCUS ON HYDRATION DURING FASTS - Acceptable Hydration sources: water, broths, tea/coffee (black tea/coffee is best but using a small amount of whole-fat dairy products in coffee/tea is acceptable).  - Poor Hydration Sources: anything with sugar or artificial sweeteners added to it  These recommendations have been developed for patients that are actively receiving medical care from either Dr. Anastasio Champion or Jeralyn Ruths, DNP, NP-C at [facility]. These recommendations are developed for patients with specific medical conditions and are not meant to be distributed or used by others that are not actively receiving care from either provider listed above at [facility]. It is not appropriate to participate in the above eating plans without proper medical supervision.   Reference: Rexanne Mano.  The obesity code. [place]; 2016.

## 2019-07-01 LAB — COMPLETE METABOLIC PANEL WITH GFR
AG Ratio: 1.6 (calc) (ref 1.0–2.5)
ALT: 24 U/L (ref 6–29)
AST: 34 U/L — ABNORMAL HIGH (ref 10–30)
Albumin: 4.2 g/dL (ref 3.6–5.1)
Alkaline phosphatase (APISO): 67 U/L (ref 31–125)
BUN: 7 mg/dL (ref 7–25)
CO2: 24 mmol/L (ref 20–32)
Calcium: 9.5 mg/dL (ref 8.6–10.2)
Chloride: 104 mmol/L (ref 98–110)
Creat: 0.81 mg/dL (ref 0.50–1.10)
GFR, Est African American: 119 mL/min/{1.73_m2} (ref >=60)
GFR, Est Non African American: 103 mL/min/{1.73_m2} (ref >=60)
Globulin: 2.7 g/dL (calc) (ref 1.9–3.7)
Glucose, Bld: 83 mg/dL (ref 65–99)
Potassium: 5 mmol/L (ref 3.5–5.3)
Sodium: 141 mmol/L (ref 135–146)
Total Bilirubin: 0.6 mg/dL (ref 0.2–1.2)
Total Protein: 6.9 g/dL (ref 6.1–8.1)

## 2019-07-01 LAB — LIPID PANEL
Cholesterol: 196 mg/dL (ref <200)
HDL: 39 mg/dL — ABNORMAL LOW (ref >=50)
LDL Cholesterol (Calc): 117 mg/dL (calc) — ABNORMAL HIGH
Non-HDL Cholesterol (Calc): 157 mg/dL (calc) — ABNORMAL HIGH (ref <130)
Total CHOL/HDL Ratio: 5 (calc) — ABNORMAL HIGH (ref <5.0)
Triglycerides: 297 mg/dL — ABNORMAL HIGH (ref <150)

## 2019-07-01 LAB — CBC
HCT: 43.7 % (ref 35.0–45.0)
Hemoglobin: 14.4 g/dL (ref 11.7–15.5)
MCH: 29.9 pg (ref 27.0–33.0)
MCHC: 33 g/dL (ref 32.0–36.0)
MCV: 90.9 fL (ref 80.0–100.0)
MPV: 9.9 fL (ref 7.5–12.5)
Platelets: 367 10*3/uL (ref 140–400)
RBC: 4.81 10*6/uL (ref 3.80–5.10)
RDW: 12.4 % (ref 11.0–15.0)
WBC: 8.9 10*3/uL (ref 3.8–10.8)

## 2019-07-01 LAB — T4, FREE: Free T4: 1.1 ng/dL (ref 0.8–1.8)

## 2019-07-01 LAB — T3, FREE: T3, Free: 4.6 pg/mL — ABNORMAL HIGH (ref 2.3–4.2)

## 2019-07-01 LAB — TSH: TSH: 3.12 mIU/L

## 2019-07-01 LAB — VITAMIN D 25 HYDROXY (VIT D DEFICIENCY, FRACTURES): Vit D, 25-Hydroxy: 58 ng/mL (ref 30–100)

## 2019-07-07 ENCOUNTER — Encounter: Payer: Self-pay | Admitting: Neurology

## 2019-07-09 DIAGNOSIS — R519 Headache, unspecified: Secondary | ICD-10-CM | POA: Insufficient documentation

## 2019-07-09 DIAGNOSIS — F4010 Social phobia, unspecified: Secondary | ICD-10-CM

## 2019-07-09 DIAGNOSIS — F959 Tic disorder, unspecified: Secondary | ICD-10-CM | POA: Insufficient documentation

## 2019-07-09 HISTORY — DX: Social phobia, unspecified: F40.10

## 2019-07-10 ENCOUNTER — Encounter: Payer: Self-pay | Admitting: Neurology

## 2019-07-10 ENCOUNTER — Telehealth (INDEPENDENT_AMBULATORY_CARE_PROVIDER_SITE_OTHER): Payer: Medicare Other | Admitting: Neurology

## 2019-07-10 ENCOUNTER — Other Ambulatory Visit: Payer: Self-pay

## 2019-07-10 VITALS — Ht 65.0 in | Wt 170.0 lb

## 2019-07-10 DIAGNOSIS — IMO0002 Reserved for concepts with insufficient information to code with codable children: Secondary | ICD-10-CM

## 2019-07-10 DIAGNOSIS — G43009 Migraine without aura, not intractable, without status migrainosus: Secondary | ICD-10-CM

## 2019-07-10 DIAGNOSIS — G43709 Chronic migraine without aura, not intractable, without status migrainosus: Secondary | ICD-10-CM

## 2019-07-10 MED ORDER — GABAPENTIN 100 MG PO CAPS: ORAL_CAPSULE | ORAL | 3 refills | Status: DC

## 2019-07-10 MED ORDER — RIZATRIPTAN BENZOATE 5 MG PO TABS: ORAL_TABLET | ORAL | 11 refills | Status: DC
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
## 2019-07-15 ENCOUNTER — Encounter (INDEPENDENT_AMBULATORY_CARE_PROVIDER_SITE_OTHER): Payer: Self-pay | Admitting: Nurse Practitioner

## 2019-07-15 ENCOUNTER — Ambulatory Visit (INDEPENDENT_AMBULATORY_CARE_PROVIDER_SITE_OTHER): Payer: Medicare Other | Admitting: Nurse Practitioner

## 2019-07-15 ENCOUNTER — Other Ambulatory Visit: Payer: Self-pay

## 2019-07-15 VITALS — BP 90/70 | HR 95 | Temp 98.3°F | Ht 65.0 in | Wt 172.2 lb

## 2019-07-15 DIAGNOSIS — R635 Abnormal weight gain: Secondary | ICD-10-CM | POA: Diagnosis not present

## 2019-07-15 DIAGNOSIS — E782 Mixed hyperlipidemia: Secondary | ICD-10-CM | POA: Diagnosis not present

## 2019-07-15 DIAGNOSIS — Z0001 Encounter for general adult medical examination with abnormal findings: Secondary | ICD-10-CM | POA: Diagnosis not present

## 2019-07-15 DIAGNOSIS — F39 Unspecified mood [affective] disorder: Secondary | ICD-10-CM | POA: Diagnosis not present
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
## 2019-07-15 NOTE — Assessment & Plan Note (Signed)
Weight is stable compared to last office visit.  As stated under plan for hyperlipidemia, patient is agreeable to trying to snack on foods such as apples with peanut butter, string cheese and fruit, and cucumbers with low-fat ranch dressing as opposed to chips a few times a week.  I encouraged her to do this.  She also has been weighing herself 2-3 times a day and I encouraged her to only weigh herself once a day but around the same time every day.  She tells me she understands.

## 2019-07-15 NOTE — Assessment & Plan Note (Signed)
She is most likely due for tetanus vaccine.  She does not like needles, and would like to hold off on this being administered today.  Consider administering tetanus shot next time blood work is collected.  She will be due for flu shot again next flu season.  I did go over all of her blood work results, she will continue medications as prescribed currently.  She will be due for next Pap smear in May 2023.

## 2019-07-15 NOTE — Assessment & Plan Note (Signed)
Cholesterol panel shows LDL is slightly above goal and triglycerides are also above goal.  I again encouraged her to reduce her intake of processed carbohydrates.  She is agreeable to trying to snack on apples with peanut butter, cucumbers and low-fat ranch dressing, and string cheese with fruit as opposed to chips a few times a week.  We will consider repeating lipid panel in approximately 6 weeks.  Consider omega-3 fish oil supplement if triglycerides remain elevated.

## 2019-07-15 NOTE — Assessment & Plan Note (Signed)
Mood appears stable today.  No changes to medication regimen recommended.

## 2019-07-15 NOTE — Patient Instructions (Signed)
Thank you for choosing [facility] as your medical provider! If you have any questions or concerns regarding your health care, please do not hesitate to call our office.  Continue trying to make healthy food choices by reducing her intake of processed carbohydrates and instead choosing whole foods such as vegetables, fruits, beans, nuts, and whole grains.  Please follow-up as scheduled in 3 months. We look forward to seeing you again soon!   At [facility] we value your feedback. You may receive a survey about your visit today. Please share your experience as we strive to create trusting relationships with our patients to provide genuine, compassionate, quality care.  We appreciate your understanding and patience as we review any laboratory studies, imaging, and other diagnostic tests that are ordered as we care for you. We do our best to address any and all results in a timely manner. If you do not hear about test results within 1 week, please do not hesitate to contact us. If we referred you to a specialist during your visit or ordered imaging testing, contact the office if you have not been contacted to be scheduled within 1 weeks.  We also encourage the use of MyChart, which contains your medical information for your review as well. If you are not enrolled in this feature, an access code is on this after visit summary for your convenience. Thank you for allowing [place] to be involved in your care.

## 2019-07-31 ENCOUNTER — Other Ambulatory Visit: Payer: Self-pay

## 2019-07-31 ENCOUNTER — Encounter (INDEPENDENT_AMBULATORY_CARE_PROVIDER_SITE_OTHER): Payer: Self-pay | Admitting: Internal Medicine

## 2019-07-31 ENCOUNTER — Ambulatory Visit (INDEPENDENT_AMBULATORY_CARE_PROVIDER_SITE_OTHER): Payer: Medicaid Other | Admitting: Internal Medicine

## 2019-07-31 VITALS — BP 90/76 | HR 99 | Temp 98.3°F | Ht 64.0 in | Wt 171.4 lb

## 2019-07-31 DIAGNOSIS — Z23 Encounter for immunization: Secondary | ICD-10-CM | POA: Diagnosis not present

## 2019-07-31 DIAGNOSIS — M79601 Pain in right arm: Secondary | ICD-10-CM
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
## 2019-07-31 NOTE — Addendum Note (Signed)
Addended by: Jani Gravel A on: 07/31/2019 12:14 PM   Modules accepted: Orders

## 2019-08-26 ENCOUNTER — Other Ambulatory Visit (INDEPENDENT_AMBULATORY_CARE_PROVIDER_SITE_OTHER): Payer: Self-pay | Admitting: Internal Medicine

## 2019-09-15 ENCOUNTER — Other Ambulatory Visit (INDEPENDENT_AMBULATORY_CARE_PROVIDER_SITE_OTHER): Payer: Self-pay | Admitting: Internal Medicine

## 2019-09-15 ENCOUNTER — Telehealth (INDEPENDENT_AMBULATORY_CARE_PROVIDER_SITE_OTHER): Payer: Self-pay | Admitting: Internal Medicine

## 2019-09-15 MED ORDER — AMANTADINE HCL 100 MG PO CAPS: 100.0000 mg | ORAL_CAPSULE | Freq: Two times a day (BID) | ORAL | 3 refills | Status: DC

## 2019-09-15 NOTE — Telephone Encounter (Signed)
I just sent the prescription now.  Thanks.

## 2019-10-18 ENCOUNTER — Other Ambulatory Visit (INDEPENDENT_AMBULATORY_CARE_PROVIDER_SITE_OTHER): Payer: Self-pay | Admitting: Internal Medicine

## 2019-10-20 ENCOUNTER — Other Ambulatory Visit: Payer: Self-pay

## 2019-10-20 ENCOUNTER — Encounter (INDEPENDENT_AMBULATORY_CARE_PROVIDER_SITE_OTHER): Payer: Self-pay | Admitting: Nurse Practitioner

## 2019-10-20 ENCOUNTER — Ambulatory Visit (INDEPENDENT_AMBULATORY_CARE_PROVIDER_SITE_OTHER): Payer: Medicare Other | Admitting: Nurse Practitioner

## 2019-10-20 VITALS — BP 90/65 | HR 114 | Temp 98.4°F | Ht 64.0 in | Wt 181.2 lb

## 2019-10-20 DIAGNOSIS — F39 Unspecified mood [affective] disorder: Secondary | ICD-10-CM | POA: Diagnosis not present

## 2019-10-20 DIAGNOSIS — Z6831 Body mass index (BMI) 31.0-31.9, adult: Secondary | ICD-10-CM | POA: Insufficient documentation

## 2019-10-20 DIAGNOSIS — E669 Obesity, unspecified: Secondary | ICD-10-CM | POA: Diagnosis not present

## 2019-10-20 DIAGNOSIS — E782 Mixed hyperlipidemia: Secondary | ICD-10-CM

## 2019-10-20 DIAGNOSIS — E559 Vitamin D deficiency, unspecified: Secondary | ICD-10-CM

## 2019-10-20 DIAGNOSIS — E66811 Obesity, class 1: Secondary | ICD-10-CM

## 2019-10-20 MED ORDER — VORTIOXETINE HBR 10 MG PO TABS: 10.0000 mg | ORAL_TABLET | Freq: Every day | ORAL | 3 refills | Status: DC

## 2019-10-20 MED ORDER — CLONIDINE HCL 0.1 MG PO TABS: 0.1000 mg | ORAL_TABLET | Freq: Every day | ORAL | 0 refills | Status: DC
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
## 2019-10-20 NOTE — Patient Instructions (Addendum)
Focus on eating cucumber slices with low-fat ranch dressing instead of chips, eat apples and peanut butter instead of sweets, make sure you monitor your portion sizes and try to eat 3 meals a day with no snacking in between meals.  Also reviewed the following below and try to follow these recommendations as closely as possible.    [facility] Dietary Recommendations for Weight Loss What to Avoid . Avoid added sugars o Often added sugar can be found in processed foods such as many condiments, dry cereals, cakes, cookies, chips, crisps, crackers, candies, sweetened drinks, etc.  o Read labels and AVOID/DECREASE use of foods with the following in their ingredient list: Sugar, fructose, high fructose corn syrup, sucrose, glucose, maltose, dextrose, molasses, cane sugar, brown sugar, any type of syrup, agave nectar, etc.   . Avoid snacking in between meals . Avoid foods made with flour o If you are going to eat food made with flour, choose those made with whole-grains; and, minimize your consumption as much as is tolerable . Avoid processed foods o These foods are generally stocked in the middle of the grocery store. Focus on shopping on the perimeter of the grocery.  . Avoid Meat  o We recommend following a plant-based diet at [facility]. Thus, we recommend avoiding meat as a general rule. Consider eating beans, legumes, eggs, and/or dairy products for regular protein sources o If you plan on eating meat limit to 4 ounces of meat at a time and choose lean options such as Fish, chicken, [place]. Avoid red meat intake such as pork and/or steak What to Include . Vegetables o GREEN LEAFY VEGETABLES: Kale, spinach, mustard greens, collard greens, cabbage, broccoli, etc. o OTHER: Asparagus, cauliflower, eggplant, carrots, peas, Brussel sprouts, tomatoes, bell peppers, zucchini, beets, cucumbers, etc. . Grains, seeds, and legumes o Beans: kidney beans, black eyed peas, garbanzo  beans, black beans, pinto beans, etc. o Whole, unrefined grains: brown rice, barley, bulgur, oatmeal, etc. . Healthy fats  o Avoid highly processed fats such as vegetable oil o Examples of healthy fats: avocado, olives, virgin olive oil, dark chocolate (?72% Cocoa), nuts (peanuts, almonds, walnuts, cashews, pecans, etc.) . None to Low Intake of Animal Sources of Protein o Meat sources: chicken, [place], salmon, tuna. Limit to 4 ounces of meat at one time. o Consider limiting dairy sources, but when choosing dairy focus on: PLAIN [place] yogurt, cottage cheese, high-protein milk . Fruit o Choose berries  When to Eat . Intermittent Fasting: o Choosing not to eat for a specific time period, but DO FOCUS ON HYDRATION when fasting o Multiple Techniques: - Time Restricted Eating: eat 3 meals in a day, each meal lasting no more than 60 minutes, no snacks between meals - 16-18 hour fast: fast for 16 to 18 hours up to 7 days a week. Often suggested to start with 2-3 nonconsecutive days per week.  . Remember the time you sleep is counted as fasting.  . Examples of eating schedule: Fast from 7:00pm-11:00am. Eat between 11:00am-7:00pm.  - 24-hour fast: fast for 24 hours up to every other day. Often suggested to start with 1 day per week . Remember the time you sleep is counted as fasting . Examples of eating schedule:  o Eating day: eat 2-3 meals on your eating day. If doing 2 meals, each meal should last no more than 90 minutes. If doing 3 meals, each meal should last no more than 60 minutes. Finish last meal by 7:00pm. o Fasting  day: Fast until 7:00pm.  o IF YOU FEEL UNWELL FOR ANY REASON/IN ANY WAY WHEN FASTING, STOP FASTING BY EATING A NUTRITIOUS SNACK OR LIGHT MEAL o ALWAYS FOCUS ON HYDRATION DURING FASTS - Acceptable Hydration sources: water, broths, tea/coffee (black tea/coffee is best but using a small amount of whole-fat dairy products in coffee/tea is acceptable).  - Poor Hydration Sources:  anything with sugar or artificial sweeteners added to it  These recommendations have been developed for patients that are actively receiving medical care from either Dr. Karilyn Cota or Jiles Prows, DNP, NP-C at [facility]. These recommendations are developed for patients with specific medical conditions and are not meant to be distributed or used by others that are not actively receiving care from either provider listed above at [facility]. It is not appropriate to participate in the above eating plans without proper medical supervision.   Reference: Lawrence Santiago. The obesity code. [place]; 2016.

## 2019-10-21 ENCOUNTER — Emergency Department (HOSPITAL_COMMUNITY)
Admission: EM | Admit: 2019-10-21 | Discharge: 2019-10-21 | Disposition: A | Discharge status: Home / Self Care | Payer: Medicare Other | Attending: Emergency Medicine | Admitting: Emergency Medicine

## 2019-10-21 ENCOUNTER — Emergency Department (HOSPITAL_COMMUNITY): Payer: Medicare Other

## 2019-10-21 ENCOUNTER — Encounter (HOSPITAL_COMMUNITY): Payer: Self-pay

## 2019-10-21 ENCOUNTER — Telehealth: Payer: Self-pay | Admitting: Neurology

## 2019-10-21 ENCOUNTER — Other Ambulatory Visit: Payer: Self-pay

## 2019-10-21 DIAGNOSIS — R55 Syncope and collapse: Secondary | ICD-10-CM | POA: Insufficient documentation

## 2019-10-21 DIAGNOSIS — M79671 Pain in right foot: Secondary | ICD-10-CM | POA: Diagnosis not present

## 2019-10-21 DIAGNOSIS — R569 Unspecified convulsions: Secondary | ICD-10-CM

## 2019-10-21 LAB — BASIC METABOLIC PANEL
Anion gap: 11 (ref 5–15)
BUN: 9 mg/dL (ref 6–20)
CO2: 23 mmol/L (ref 22–32)
Calcium: 9 mg/dL (ref 8.9–10.3)
Chloride: 104 mmol/L (ref 98–111)
Creatinine, Ser: 0.75 mg/dL (ref 0.44–1.00)
GFR calc Af Amer: >60 mL/min (ref >60)
GFR calc non Af Amer: >60 mL/min (ref >60)
Glucose, Bld: 109 mg/dL — ABNORMAL HIGH (ref 70–99)
Potassium: 3.7 mmol/L (ref 3.5–5.1)
Sodium: 138 mmol/L (ref 135–145)

## 2019-10-21 LAB — CBC WITH DIFFERENTIAL/PLATELET
Abs Immature Granulocytes: 0.08 10*3/uL — ABNORMAL HIGH (ref 0.00–0.07)
Basophils Absolute: 0.1 10*3/uL (ref 0.0–0.1)
Basophils Relative: 1 %
Eosinophils Absolute: 0.2 10*3/uL (ref 0.0–0.5)
Eosinophils Relative: 2 %
HCT: 45.1 % (ref 36.0–46.0)
Hemoglobin: 14.4 g/dL (ref 12.0–15.0)
Immature Granulocytes: 1 %
Lymphocytes Relative: 23 %
Lymphs Abs: 2.5 10*3/uL (ref 0.7–4.0)
MCH: 29.5 pg (ref 26.0–34.0)
MCHC: 31.9 g/dL (ref 30.0–36.0)
MCV: 92.4 fL (ref 80.0–100.0)
Monocytes Absolute: 0.7 10*3/uL (ref 0.1–1.0)
Monocytes Relative: 6 %
Neutro Abs: 7.4 10*3/uL (ref 1.7–7.7)
Neutrophils Relative %: 67 %
Platelets: 412 10*3/uL — ABNORMAL HIGH (ref 150–400)
RBC: 4.88 MIL/uL (ref 3.87–5.11)
RDW: 12.8 % (ref 11.5–15.5)
WBC: 10.9 10*3/uL — ABNORMAL HIGH (ref 4.0–10.5)
nRBC: 0 % (ref 0.0–0.2)

## 2019-10-21 LAB — URINALYSIS, ROUTINE W REFLEX MICROSCOPIC
Bacteria, UA: NONE SEEN
Bilirubin Urine: NEGATIVE
Glucose, UA: NEGATIVE mg/dL
Ketones, ur: NEGATIVE mg/dL
Leukocytes,Ua: NEGATIVE
Nitrite: NEGATIVE
Protein, ur: NEGATIVE mg/dL
Specific Gravity, Urine: 1.004 — ABNORMAL LOW (ref 1.005–1.030)
pH: 7 (ref 5.0–8.0)

## 2019-10-21 LAB — PREGNANCY, URINE: Preg Test, Ur: NEGATIVE
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
## 2019-10-21 NOTE — Telephone Encounter (Signed)
Pls let [name] know I reviewed the ER notes, it happened with blood draw, it sounds like convulsive syncope and not an epileptic seizure, but we will do an EEG. Pls order routine (30 min) EEG, if abnormal, she will follow-up sooner, if not, f/u in Aug as scheduled.

## 2019-10-21 NOTE — Addendum Note (Signed)
Addended by: Dimas Chyle on: 10/21/2019 03:47 PM   Modules accepted: Orders

## 2019-10-21 NOTE — ED Triage Notes (Signed)
Pt was at her drs office and was having blood drawn and when needle was pulled out she passed out and had questionable seizure activity per EMS. Reports she remembers waking up with people around. Reports pain right foot in when came too

## 2019-10-21 NOTE — Telephone Encounter (Signed)
Left voice mail for Jennifer Higgins to call the office back EEG order placed

## 2019-10-21 NOTE — Discharge Instructions (Addendum)
Your labs and exam are reassuring today.  It is possible this mornings episode was an episode of syncope (passing out) from the blood draw stress, however, with the described shaking during the event, we cannot completely rule out the possibility of this being a new onset seizure.  We do recommend a close follow up with Dr. Karel Jarvis as discussed.  Call for an appointment with her.  In the interim, avoid any activities such as climbing (ladders, etc) , driving or operating dangerous equipment which could cause more severe injury if you would have a recurrent episode of seizure.  Continue taking your regular home medicines.

## 2019-10-21 NOTE — Telephone Encounter (Signed)
Patient appeared to have a seizure and was taken to ED at [facility]. ED wanted the patient to follow up with [place]. The patient's guardian was just wanting to double check with Dr. Karel Jarvis if it's ok for the patient to wait until the 01/07/20 scheduled appointment or if she wanted to see her earlier.

## 2019-10-22 LAB — TEST IN QUESTION-CARDIO IQ™ MISC QUEST

## 2019-10-22 NOTE — Telephone Encounter (Signed)
Spoke to [name] and informed her that we would be calling her to get Mrs Jennifer Higgins scheduled for EEG

## 2019-10-28 LAB — CARDIO IQ ADV LIPID AND INFLAMM PNL
Apolipoprotein B: 119 mg/dL — ABNORMAL HIGH (ref <90)
Cholesterol: 214 mg/dL — ABNORMAL HIGH (ref <200)
HDL: 57 mg/dL (ref >49)
LDL Cholesterol (Calc): 123 mg/dL (calc) — ABNORMAL HIGH (ref <100)
LDL Large: 6730 nmol/L (ref >6729)
LDL Medium: 488 nmol/L — ABNORMAL HIGH (ref <215)
LDL Particle Number: 1943 nmol/L — ABNORMAL HIGH (ref <1138)
LDL Peak Size: 219.9 Angstrom — ABNORMAL LOW (ref >222.9)
LDL Small: 336 nmol/L — ABNORMAL HIGH (ref <142)
Lipoprotein (a): 123 nmol/L — ABNORMAL HIGH (ref <75)
Non-HDL Cholesterol (Calc): 157 mg/dL (calc) — ABNORMAL HIGH (ref <130)
PLAC: 85 nmol/min/mL (ref <124)
Total CHOL/HDL Ratio: 3.8 calc — ABNORMAL HIGH (ref <3.6)
Triglycerides: 226 mg/dL — ABNORMAL HIGH (ref <150)
hs-CRP: >10 mg/L — ABNORMAL HIGH (ref <1.0)

## 2019-10-28 LAB — COMPLETE METABOLIC PANEL WITH GFR
AG Ratio: 1.8 (calc) (ref 1.0–2.5)
ALT: 32 U/L — ABNORMAL HIGH (ref 6–29)
AST: 38 U/L — ABNORMAL HIGH (ref 10–30)
Albumin: 4.2 g/dL (ref 3.6–5.1)
Alkaline phosphatase (APISO): 79 U/L (ref 31–125)
BUN: 9 mg/dL (ref 7–25)
CO2: 24 mmol/L (ref 20–32)
Calcium: 9 mg/dL (ref 8.6–10.2)
Chloride: 103 mmol/L (ref 98–110)
Creat: 0.89 mg/dL (ref 0.50–1.10)
GFR, Est African American: 107 mL/min/{1.73_m2} (ref >=60)
GFR, Est Non African American: 92 mL/min/{1.73_m2} (ref >=60)
Globulin: 2.4 g/dL (calc) (ref 1.9–3.7)
Glucose, Bld: 90 mg/dL (ref 65–99)
Potassium: 4.4 mmol/L (ref 3.5–5.3)
Sodium: 139 mmol/L (ref 135–146)
Total Bilirubin: 0.5 mg/dL (ref 0.2–1.2)
Total Protein: 6.6 g/dL (ref 6.1–8.1)

## 2019-10-28 LAB — TSH: TSH: 4.25 mIU/L

## 2019-10-28 LAB — CARDIO IQ INSULIN RESISTANCE PANEL WITH SCORE
C-PEPTIDE, LC/MS/MS: 3.15 ng/mL — ABNORMAL HIGH (ref 0.68–2.16)
INSULIN, INTACT, LC/MS/MS: 18 u[IU]/mL — ABNORMAL HIGH (ref <=16)
Insulin Resistance Score: 93 — ABNORMAL HIGH (ref <=66)

## 2019-10-28 LAB — EXTRA SPECIMEN

## 2019-10-28 LAB — TEST AUTHORIZATION

## 2019-10-28 LAB — VITAMIN D 25 HYDROXY (VIT D DEFICIENCY, FRACTURES): Vit D, 25-Hydroxy: 57 ng/mL (ref 30–100)

## 2019-10-28 LAB — T3, FREE: T3, Free: 3.8 pg/mL (ref 2.3–4.2)

## 2019-10-28 LAB — T4, FREE: Free T4: 1.1 ng/dL (ref 0.8–1.8)

## 2019-11-27 DIAGNOSIS — K219 Gastro-esophageal reflux disease without esophagitis: Secondary | ICD-10-CM

## 2019-11-27 HISTORY — DX: Gastro-esophageal reflux disease without esophagitis: K21.9

## 2019-12-03 ENCOUNTER — Ambulatory Visit (INDEPENDENT_AMBULATORY_CARE_PROVIDER_SITE_OTHER): Payer: Medicare Other | Admitting: Internal Medicine

## 2019-12-03 ENCOUNTER — Other Ambulatory Visit: Payer: Self-pay

## 2019-12-03 ENCOUNTER — Encounter (INDEPENDENT_AMBULATORY_CARE_PROVIDER_SITE_OTHER): Payer: Self-pay | Admitting: Internal Medicine

## 2019-12-03 ENCOUNTER — Other Ambulatory Visit (INDEPENDENT_AMBULATORY_CARE_PROVIDER_SITE_OTHER): Payer: Self-pay

## 2019-12-03 VITALS — BP 126/80 | HR 76 | Temp 97.6°F | Resp 18 | Ht 64.0 in | Wt 186.8 lb

## 2019-12-03 DIAGNOSIS — E782 Mixed hyperlipidemia: Secondary | ICD-10-CM

## 2019-12-03 DIAGNOSIS — E669 Obesity, unspecified: Secondary | ICD-10-CM

## 2019-12-03 DIAGNOSIS — R450 Nervousness: Secondary | ICD-10-CM

## 2019-12-03 DIAGNOSIS — E8881 Metabolic syndrome: Secondary | ICD-10-CM | POA: Diagnosis not present
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
## 2019-12-03 NOTE — Progress Notes (Signed)
Pt social work called back to get a referral to see GI per guardian request. Pt has been having some issues, and mom Hx with wilson's disease they would like it checked out. [facility] hs her mothers records to help with her conditions.

## 2019-12-08 ENCOUNTER — Telehealth (INDEPENDENT_AMBULATORY_CARE_PROVIDER_SITE_OTHER): Payer: Self-pay

## 2019-12-16 ENCOUNTER — Ambulatory Visit (INDEPENDENT_AMBULATORY_CARE_PROVIDER_SITE_OTHER): Payer: Medicare Other | Admitting: Nurse Practitioner

## 2019-12-16 ENCOUNTER — Other Ambulatory Visit: Payer: Self-pay

## 2019-12-16 ENCOUNTER — Encounter: Payer: Self-pay | Admitting: Nurse Practitioner

## 2019-12-16 VITALS — BP 120/81 | HR 104 | Temp 97.5°F | Ht 64.0 in | Wt 189.4 lb

## 2019-12-16 DIAGNOSIS — Z8349 Family history of other endocrine, nutritional and metabolic diseases: Secondary | ICD-10-CM | POA: Insufficient documentation

## 2019-12-16 DIAGNOSIS — K219 Gastro-esophageal reflux disease without esophagitis: Secondary | ICD-10-CM | POA: Insufficient documentation

## 2019-12-16 DIAGNOSIS — K5909 Other constipation: Secondary | ICD-10-CM | POA: Diagnosis not present

## 2019-12-16 MED ORDER — OMEPRAZOLE 20 MG PO CPDR: 20.0000 mg | DELAYED_RELEASE_CAPSULE | Freq: Two times a day (BID) | ORAL | 3 refills | Status: DC
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
## 2019-12-16 NOTE — Patient Instructions (Signed)
Your health issues we discussed today were:   Constipation: 1. Continue taking senna to help your constipation 2. Let us know if you have any worsening or severe symptoms  GERD (reflux/heartburn): 1. I have sent a prescription for Prilosec 20 mg to your pharmacy.  Take this twice a day, pursing in the morning on an empty stomach and 30 minutes before your last meal the day 2. Let us know if you have any worsening or severe symptoms  Family history of Wilson's disease: 1. Have your blood test completed when you are able to 2. At the lab they will give you a urine collection kit for 24-hour urine collection.  Have this completed as soon as you can 3. We will help schedule your ultrasound for you 4. We will refer you to genetic testing to evaluate for status of genes related to Wilson's disease 5. At your next eye exam, ask them to check for "Kayser-Fleischer rings" 6. Call us if you have any significant changes  Overall I recommend:  1. Continue your other current medications 2. Return for follow-up in 2 months 3. Call us for any questions or concerns  ---------------------------------------------------------------  COVID-19 Vaccine Information can be found at: [email or web address] For questions related to vaccine distribution or appointments, please email [email or web address] or call [phone].   ---------------------------------------------------------------   At [facility] we value your feedback. You may receive a survey about your visit today. Please share your experience as we strive to create trusting relationships with our patients to provide genuine, compassionate, quality care.  We appreciate your understanding and patience as we review any laboratory studies, imaging, and other diagnostic tests that are ordered as we care for you. Our office policy is 5 business days for review of these results, and  any emergent or urgent results are addressed in a timely manner for your best interest. If you do not hear from our office in 1 week, please contact us.   We also encourage the use of MyChart, which contains your medical information for your review as well. If you are not enrolled in this feature, an access code is on this after visit summary for your convenience. Thank you for allowing [place] to be involved in your care.  It was great to see you today!  I hope you have a great Summer!!

## 2019-12-17 ENCOUNTER — Telehealth: Payer: Self-pay

## 2019-12-17 NOTE — Telephone Encounter (Signed)
Minerva Areola, I received the following referral message:  Delaney Meigs, LPN Good morning,   I wanted to inform you that our genetic counselors don't see patients for wilson's disease. We only see patients for a personal of family hx of cancer.   Sue Lush

## 2019-12-22 ENCOUNTER — Other Ambulatory Visit: Payer: Self-pay

## 2019-12-22 ENCOUNTER — Ambulatory Visit (HOSPITAL_COMMUNITY)
Admission: RE | Admit: 2019-12-22 | Discharge: 2019-12-22 | Disposition: A | Discharge status: Home / Self Care | Payer: Medicare Other | Source: Ambulatory Visit | Attending: Nurse Practitioner | Admitting: Nurse Practitioner

## 2019-12-22 DIAGNOSIS — K5909 Other constipation: Secondary | ICD-10-CM | POA: Diagnosis not present

## 2019-12-22 DIAGNOSIS — Z8349 Family history of other endocrine, nutritional and metabolic diseases: Secondary | ICD-10-CM

## 2019-12-22 DIAGNOSIS — R7989 Other specified abnormal findings of blood chemistry: Secondary | ICD-10-CM | POA: Diagnosis not present

## 2019-12-22 DIAGNOSIS — K219 Gastro-esophageal reflux disease without esophagitis: Secondary | ICD-10-CM | POA: Diagnosis not present

## 2019-12-22 NOTE — Telephone Encounter (Signed)
Ok, will hold off for a bit until we get the test results back. If need be we can refer to [facility].

## 2019-12-23 NOTE — Telephone Encounter (Signed)
Noted  

## 2019-12-24 NOTE — Assessment & Plan Note (Signed)
Significant family history of Wilson's disease in her mother (who passed from cirrhosis due to Wilson's disease at age 23) as well as her maternal aunt.  She was apparently tested for Wilson's disease as a child but they have not been able to locate records.  Sees an eye doctor but have not tested for signs of Wilson's.  Occasionally she has tremors or shakes but they feel this is likely related to her anxiety.  Overall her LFTs have been mildly elevated with AST/ALT at 38/32, but alkaline phosphatase and bilirubin normal.  She does have hyperlipidemia and is obese with insulin resistance and could have an element of fatty liver disease related to metabolic syndrome.  We will work her up for Wilson's disease including ceruloplasmin and 24-hour urine copper.  I have asked her guardian to request the eye doctor to check her for Kayser-Fleischer rings at her upcoming eye appointment.  I will also check an abdominal ultrasound to evaluate for any abnormalities.  I will refer her for genetic testing for family history of Wilson's disease with severe ramifications in her mother and aunt.  Follow-up in 2 months.

## 2019-12-24 NOTE — Assessment & Plan Note (Signed)
Intermittent constipation which responds pretty well to senna.  Recommend she continue this, call us for any worsening or severe symptoms and we can discuss other additional options.  Follow-up in 2 months.

## 2019-12-24 NOTE — Assessment & Plan Note (Signed)
Apparent GERD with symptomatic presentation not currently on medication.  I recommended she start omeprazole 20 mg twice daily and have sent a prescription to her pharmacy.  Follow-up in 2 months.

## 2019-12-25 ENCOUNTER — Other Ambulatory Visit: Payer: Self-pay | Admitting: Emergency Medicine

## 2019-12-25 DIAGNOSIS — K5909 Other constipation: Secondary | ICD-10-CM | POA: Diagnosis not present

## 2019-12-25 DIAGNOSIS — K219 Gastro-esophageal reflux disease without esophagitis: Secondary | ICD-10-CM | POA: Diagnosis not present

## 2019-12-25 DIAGNOSIS — Z8349 Family history of other endocrine, nutritional and metabolic diseases: Secondary | ICD-10-CM | POA: Diagnosis not present

## 2019-12-26 LAB — HEPATIC FUNCTION PANEL
AG Ratio: 1.5 (calc) (ref 1.0–2.5)
ALT: 24 U/L (ref 6–29)
AST: 29 U/L (ref 10–30)
Albumin: 4.2 g/dL (ref 3.6–5.1)
Alkaline phosphatase (APISO): 66 U/L (ref 31–125)
Bilirubin, Direct: 0.1 mg/dL (ref 0.0–0.2)
Globulin: 2.8 g/dL (calc) (ref 1.9–3.7)
Indirect Bilirubin: 0.3 mg/dL (calc) (ref 0.2–1.2)
Total Bilirubin: 0.4 mg/dL (ref 0.2–1.2)
Total Protein: 7 g/dL (ref 6.1–8.1)

## 2019-12-26 LAB — CERULOPLASMIN: Ceruloplasmin: 40 mg/dL (ref 18–53)

## 2019-12-29 ENCOUNTER — Encounter: Payer: Self-pay | Admitting: Nurse Practitioner

## 2019-12-29 DIAGNOSIS — Z8349 Family history of other endocrine, nutritional and metabolic diseases: Secondary | ICD-10-CM | POA: Diagnosis not present

## 2019-12-31 LAB — COPPER, URINE, 24 HOUR
Copper,Urine (24 Hr): 9 mcg/24 h — ABNORMAL LOW (ref 15–60)
Total Volume: 1100 mL

## 2020-01-05 ENCOUNTER — Other Ambulatory Visit (INDEPENDENT_AMBULATORY_CARE_PROVIDER_SITE_OTHER): Payer: Self-pay

## 2020-01-05 DIAGNOSIS — F39 Unspecified mood [affective] disorder: Secondary | ICD-10-CM

## 2020-01-05 MED ORDER — VORTIOXETINE HBR 10 MG PO TABS: 10.0000 mg | ORAL_TABLET | Freq: Every day | ORAL | 3 refills | Status: DC

## 2020-01-07 ENCOUNTER — Ambulatory Visit (INDEPENDENT_AMBULATORY_CARE_PROVIDER_SITE_OTHER): Payer: Medicare Other | Admitting: Neurology

## 2020-01-07 ENCOUNTER — Other Ambulatory Visit: Payer: Self-pay

## 2020-01-07 ENCOUNTER — Encounter: Payer: Self-pay | Admitting: Neurology

## 2020-01-07 VITALS — BP 119/85 | HR 104 | Ht 64.0 in | Wt 189.0 lb

## 2020-01-07 DIAGNOSIS — G43009 Migraine without aura, not intractable, without status migrainosus: Secondary | ICD-10-CM

## 2020-01-07 MED ORDER — GABAPENTIN 300 MG PO CAPS
300.0000 mg | ORAL_CAPSULE | Freq: Every day | ORAL | 3 refills | Status: DC
Start: 2020-01-07 — End: 2020-08-12

## 2020-01-07 NOTE — Telephone Encounter (Signed)
Pt has been to referral appt.

## 2020-01-07 NOTE — Patient Instructions (Signed)
1. We will increase your gabapentin to 300mg  every night. A new bottle for gabapentin 300mg : Take 1 capsule every night has been sent to your pharmacy  2. Ok to take Excedrin migraine as needed, do not take more than 2-3 times a week, otherwise this can cause more headaches if taking it too much  3. Follow-up in 6 months, call for any changes
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
## 2020-01-20 ENCOUNTER — Ambulatory Visit (INDEPENDENT_AMBULATORY_CARE_PROVIDER_SITE_OTHER): Payer: Medicare Other | Admitting: Internal Medicine

## 2020-02-17 ENCOUNTER — Ambulatory Visit: Payer: Medicare Other | Admitting: Nurse Practitioner

## 2020-02-24 ENCOUNTER — Other Ambulatory Visit: Payer: Self-pay

## 2020-02-24 ENCOUNTER — Encounter: Payer: Self-pay | Admitting: Nurse Practitioner

## 2020-02-24 ENCOUNTER — Telehealth: Payer: Self-pay | Admitting: *Deleted

## 2020-02-24 ENCOUNTER — Ambulatory Visit (INDEPENDENT_AMBULATORY_CARE_PROVIDER_SITE_OTHER): Payer: Medicare Other | Admitting: Nurse Practitioner

## 2020-02-24 VITALS — BP 136/90 | HR 121 | Temp 97.3°F | Ht 64.0 in | Wt 190.6 lb

## 2020-02-24 DIAGNOSIS — R1084 Generalized abdominal pain: Secondary | ICD-10-CM

## 2020-02-24 DIAGNOSIS — K5909 Other constipation: Secondary | ICD-10-CM

## 2020-02-24 DIAGNOSIS — R112 Nausea with vomiting, unspecified: Secondary | ICD-10-CM | POA: Diagnosis not present

## 2020-02-24 DIAGNOSIS — Z8349 Family history of other endocrine, nutritional and metabolic diseases: Secondary | ICD-10-CM

## 2020-02-24 DIAGNOSIS — K219 Gastro-esophageal reflux disease without esophagitis: Secondary | ICD-10-CM | POA: Diagnosis not present

## 2020-02-24 NOTE — Telephone Encounter (Signed)
Called pt, VM full. She needs to be scheduled for EGD, propofol, Dr. Jena Gauss, ASA 2
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
## 2020-02-24 NOTE — Progress Notes (Signed)
Cc'ed to pcp °

## 2020-02-24 NOTE — Telephone Encounter (Signed)
Spoke with pt Manufacturing systems engineer. Pt has been scheduled for procedure 10/28 at 9:15am. covid test scheduled for 10/27 at 8:05am. I have faxed this information to [facility] for pt to [phone].
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
## 2020-02-24 NOTE — Patient Instructions (Addendum)
Your health issues we discussed today were:   Family history of Wilson's disease: 1. As we discussed, your work-up suggested she do not have Wilson's disease 2. Let us know if you have any concerning symptoms  Constipation: 1. Continue taking senna as it seems to be working well for you 2. Let us know if you have any worsening constipation  Upper abdominal pain with persistent nausea/vomiting and history of GERD (reflux/heartburn): 1. Continue taking Prilosec twice daily 2. Let us know if you have any blood when you throw up 3. As we discussed, we will plan for an upper endoscopy to further evaluate for GI causes 4. Further recommendations will follow your endoscopy  Overall I recommend:  1. Continue your other current medications 2. Return for follow-up in 4 months 3. Call us for any questions or concerns   ---------------------------------------------------------------  I am glad you have gotten your COVID-19 vaccination!  Even though you are fully vaccinated you should continue to follow CDC and state/local guidelines.  ---------------------------------------------------------------   At [facility] we value your feedback. You may receive a survey about your visit today. Please share your experience as we strive to create trusting relationships with our patients to provide genuine, compassionate, quality care.  We appreciate your understanding and patience as we review any laboratory studies, imaging, and other diagnostic tests that are ordered as we care for you. Our office policy is 5 business days for review of these results, and any emergent or urgent results are addressed in a timely manner for your best interest. If you do not hear from our office in 1 week, please contact us.   We also encourage the use of MyChart, which contains your medical information for your review as well. If you are not enrolled in this feature, an access code is on this after visit  summary for your convenience. Thank you for allowing [place] to be involved in your care.  It was great to see you today!  I hope you have a great Fall!!

## 2020-03-03 ENCOUNTER — Other Ambulatory Visit (INDEPENDENT_AMBULATORY_CARE_PROVIDER_SITE_OTHER): Payer: Self-pay | Admitting: Nurse Practitioner

## 2020-03-03 DIAGNOSIS — F39 Unspecified mood [affective] disorder: Secondary | ICD-10-CM

## 2020-03-11 ENCOUNTER — Other Ambulatory Visit: Payer: Self-pay

## 2020-03-11 ENCOUNTER — Ambulatory Visit (INDEPENDENT_AMBULATORY_CARE_PROVIDER_SITE_OTHER): Payer: Medicare Other | Admitting: Nurse Practitioner

## 2020-03-11 ENCOUNTER — Encounter (INDEPENDENT_AMBULATORY_CARE_PROVIDER_SITE_OTHER): Payer: Self-pay | Admitting: Nurse Practitioner

## 2020-03-11 VITALS — BP 128/84 | HR 111 | Temp 97.7°F | Ht 64.0 in | Wt 191.2 lb

## 2020-03-11 DIAGNOSIS — E782 Mixed hyperlipidemia: Secondary | ICD-10-CM

## 2020-03-11 DIAGNOSIS — E559 Vitamin D deficiency, unspecified: Secondary | ICD-10-CM

## 2020-03-11 DIAGNOSIS — Z6832 Body mass index (BMI) 32.0-32.9, adult: Secondary | ICD-10-CM | POA: Diagnosis not present

## 2020-03-11 DIAGNOSIS — E669 Obesity, unspecified: Secondary | ICD-10-CM | POA: Diagnosis not present

## 2020-03-11 DIAGNOSIS — Z23 Encounter for immunization: Secondary | ICD-10-CM
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
## 2020-03-24 ENCOUNTER — Other Ambulatory Visit (HOSPITAL_COMMUNITY)
Admission: RE | Admit: 2020-03-24 | Discharge: 2020-03-24 | Disposition: A | Discharge status: Home / Self Care | Payer: Medicare Other | Source: Ambulatory Visit | Attending: Internal Medicine | Admitting: Internal Medicine

## 2020-03-24 ENCOUNTER — Other Ambulatory Visit: Payer: Self-pay

## 2020-03-24 DIAGNOSIS — Z20822 Contact with and (suspected) exposure to covid-19: Secondary | ICD-10-CM | POA: Diagnosis not present

## 2020-03-24 DIAGNOSIS — Z01812 Encounter for preprocedural laboratory examination: Secondary | ICD-10-CM | POA: Diagnosis not present

## 2020-03-24 LAB — SARS CORONAVIRUS 2 (TAT 6-24 HRS): SARS Coronavirus 2: NEGATIVE

## 2020-03-24 LAB — PREGNANCY, URINE: Preg Test, Ur: NEGATIVE

## 2020-03-25 ENCOUNTER — Ambulatory Visit (HOSPITAL_COMMUNITY): Payer: Medicare Other | Admitting: Anesthesiology

## 2020-03-25 ENCOUNTER — Ambulatory Visit (HOSPITAL_COMMUNITY)
Admission: RE | Admit: 2020-03-25 | Discharge: 2020-03-25 | Disposition: A | Discharge status: Home / Self Care | Payer: Medicare Other | Attending: Internal Medicine | Admitting: Internal Medicine

## 2020-03-25 ENCOUNTER — Other Ambulatory Visit: Payer: Self-pay

## 2020-03-25 ENCOUNTER — Encounter (HOSPITAL_COMMUNITY)
Admission: RE | Disposition: A | Discharge status: Home / Self Care | Payer: Self-pay | Source: Home / Self Care | Attending: Internal Medicine

## 2020-03-25 ENCOUNTER — Encounter (HOSPITAL_COMMUNITY): Payer: Self-pay | Admitting: Internal Medicine

## 2020-03-25 DIAGNOSIS — K449 Diaphragmatic hernia without obstruction or gangrene: Secondary | ICD-10-CM | POA: Insufficient documentation

## 2020-03-25 DIAGNOSIS — K2210 Ulcer of esophagus without bleeding: Secondary | ICD-10-CM | POA: Insufficient documentation

## 2020-03-25 DIAGNOSIS — K2100 Gastro-esophageal reflux disease with esophagitis, without bleeding: Secondary | ICD-10-CM | POA: Insufficient documentation

## 2020-03-25 DIAGNOSIS — R12 Heartburn: Secondary | ICD-10-CM | POA: Diagnosis not present

## 2020-03-25 HISTORY — PX: ESOPHAGOGASTRODUODENOSCOPY (EGD) WITH PROPOFOL: SHX5813

## 2020-03-25 SURGERY — ESOPHAGOGASTRODUODENOSCOPY (EGD) WITH PROPOFOL
Anesthesia: General

## 2020-03-25 MED ORDER — LACTATED RINGERS IV SOLN: INTRAVENOUS | Status: DC | PRN

## 2020-03-25 MED ORDER — ACETAMINOPHEN 10 MG/ML IV SOLN: 1000.0000 mg | Freq: Once | INTRAVENOUS | Status: DC | PRN

## 2020-03-25 MED ORDER — MIDAZOLAM HCL 2 MG/2ML IJ SOLN
INTRAMUSCULAR | Status: AC
  Filled 2020-03-25: qty 2

## 2020-03-25 MED ORDER — LIDOCAINE HCL (CARDIAC) PF 100 MG/5ML IV SOSY
PREFILLED_SYRINGE | INTRAVENOUS | Status: DC | PRN
  Administered 2020-03-25: 100 mg via INTRATRACHEAL

## 2020-03-25 MED ORDER — PROPOFOL 10 MG/ML IV BOLUS
INTRAVENOUS | Status: DC | PRN
  Administered 2020-03-25: 100 mg via INTRAVENOUS
  Administered 2020-03-25: 70 mg via INTRAVENOUS

## 2020-03-25 MED ORDER — MEPERIDINE HCL 50 MG/ML IJ SOLN: 6.2500 mg | INTRAMUSCULAR | Status: DC | PRN

## 2020-03-25 MED ORDER — LACTATED RINGERS IV SOLN: INTRAVENOUS | Status: DC

## 2020-03-25 MED ORDER — OXYCODONE HCL 5 MG/5ML PO SOLN: 5.0000 mg | Freq: Once | ORAL | Status: DC | PRN

## 2020-03-25 MED ORDER — DROPERIDOL 2.5 MG/ML IJ SOLN: 0.6250 mg | Freq: Once | INTRAMUSCULAR | Status: DC | PRN

## 2020-03-25 MED ORDER — MIDAZOLAM HCL 2 MG/2ML IJ SOLN
INTRAMUSCULAR | Status: DC | PRN
  Administered 2020-03-25: 1 mg via INTRAVENOUS

## 2020-03-25 MED ORDER — STERILE WATER FOR IRRIGATION IR SOLN
Status: DC | PRN
  Administered 2020-03-25: 100 mL

## 2020-03-25 MED ORDER — ONDANSETRON HCL 4 MG/2ML IJ SOLN: 4.0000 mg | Freq: Once | INTRAMUSCULAR | Status: DC | PRN

## 2020-03-25 MED ORDER — HYDROMORPHONE HCL 1 MG/ML IJ SOLN: 0.2500 mg | INTRAMUSCULAR | Status: DC | PRN

## 2020-03-25 MED ORDER — PROPOFOL 500 MG/50ML IV EMUL
INTRAVENOUS | Status: DC | PRN
  Administered 2020-03-25: 150 ug/kg/min via INTRAVENOUS

## 2020-03-25 MED ORDER — OXYCODONE HCL 5 MG PO TABS: 5.0000 mg | ORAL_TABLET | Freq: Once | ORAL | Status: DC | PRN

## 2020-03-25 NOTE — Transfer of Care (Signed)
Immediate Anesthesia Transfer of Care Note  Patient: Jennifer Higgins  Procedure(s) Performed: ESOPHAGOGASTRODUODENOSCOPY (EGD) WITH PROPOFOL (N/A )  Patient Location: PACU  Anesthesia Type:General  Level of Consciousness: drowsy  Airway & Oxygen Therapy: Patient Spontanous Breathing  Post-op Assessment: Report given to RN  Post vital signs: stable  Last Vitals:  Vitals Value Taken Time  BP    Temp    Pulse    Resp    SpO2      Last Pain:  Vitals:   03/25/20 0901  TempSrc:   PainSc: 0-No pain      Patients Stated Pain Goal: 3 (03/25/20 0816)  Complications: No complications documented.

## 2020-03-25 NOTE — Op Note (Signed)
[facility] Patient Name: Jennifer Higgins Procedure Date: 03/25/2020 8:33 AM MRN: [id number] Date of Birth: [birth year] Attending MD: Gennette Pacobert Michael Dakoda [place] , MD CSN: [id number] Age: 24 Admit Type: Outpatient Procedure:                Upper GI endoscopy Indications:              Heartburn Providers:                Gennette Pacobert Michael Tashawn Laswell, MD, Sheran FavaJessica Boudreaux,                            Crystal Page, Judeth CornfieldKristine L. Jessee AversBoone Tech, Technician,                            Edythe ClarityKelly Cox, Pensions consultantTechnician Referring MD:              Medicines:                Propofol per Anesthesia Complications:            No immediate complications. Estimated Blood Loss:     Estimated blood loss: none. Procedure:                Pre-Anesthesia Assessment:                           - Prior to the procedure, a History and Physical                            was performed, and patient medications and                            allergies were reviewed. The patient's tolerance of                            previous anesthesia was also reviewed. The risks                            and benefits of the procedure and the sedation                            options and risks were discussed with the patient.                            All questions were answered, and informed consent                            was obtained. Prior Anticoagulants: The patient has                            taken no previous anticoagulant or antiplatelet                            agents. ASA Grade Assessment: II - A patient with  mild systemic disease. After reviewing the risks                            and benefits, the patient was deemed in                            satisfactory condition to undergo the procedure.                           After obtaining informed consent, the endoscope was                            passed under direct vision. Throughout the                            procedure, the patient's blood  pressure, pulse, and                            oxygen saturations were monitored continuously. The                            [id number]-H190 ([id number]) scope was introduced through the                            mouth, and advanced to the second part of duodenum.                            The upper GI endoscopy was accomplished without                            difficulty. The patient tolerated the procedure                            well. Scope In: 9:05:11 AM Scope Out: 9:09:09 AM Total Procedure Duration: 0 hours 3 minutes 58 seconds  Findings:      Circumferential distal esophageal erosions with a couple of ulcers       within 5 to 7 mm the GE junction. Patulous EG junction. No Barrett's       epithelium seen. 2 cm hiatal hernia.      The entire examined stomach was normal otherwise.      The duodenal bulb and second portion of the duodenum were normal. Impression:               -Ulcerative/erosive reflux esophagitis. Patulous EG                            junction. Small hiatal hernia; otherwise, normal                            stomach.                           - Normal duodenal bulb and second portion of the  duodenum.                           - No specimens collected. Moderate Sedation:      Moderate (conscious) sedation was personally administered by an       anesthesia professional. The following parameters were monitored: oxygen       saturation, heart rate, blood pressure, respiratory rate, EKG, adequacy       of pulmonary ventilation, and response to care.      Moderate (conscious) sedation was personally administered by an       anesthesia professional. The following parameters were monitored: oxygen       saturation, heart rate, blood pressure, respiratory rate, EKG, adequacy       of pulmonary ventilation, and response to care. Recommendation:           - Patient has a contact number available for                            emergencies. The  signs and symptoms of potential                            delayed complications were discussed with the                            patient. Return to normal activities tomorrow.                            Written discharge instructions were provided to the                            patient.                           - Advance diet as tolerated.                           - Continue present medications except stop                            omeprazole/Prilosec; begin Protonix 40 mg twice                            daily. Anti- reflux diet/information provided 10                            pound weight loss next 3 months..                           - Return to my office in 3 months. Procedure Code(s):        --- Professional ---                           [phone], Esophagogastroduodenoscopy, flexible,                            transoral; diagnostic, including collection of  specimen(s) by brushing or washing, when performed                            (separate procedure) Diagnosis Code(s):        --- Professional ---                           R12, Heartburn CPT copyright 2019 American Medical Association. All rights reserved. The codes documented in this report are preliminary and upon coder review may  be revised to meet current compliance requirements. Gerrit Friends. Elonzo Sopp, MD Gennette Pac, MD 03/25/2020 9:17:39 AM This report has been signed electronically. Number of Addenda: 0

## 2020-03-25 NOTE — Interval H&P Note (Signed)
History and Physical Interval Note:  03/25/2020 8:53 AM  Jennifer Higgins  has presented today for surgery, with the diagnosis of nausea, vomiting, gerd.  The various methods of treatment have been discussed with the patient and family. After consideration of risks, benefits and other options for treatment, the patient has consented to  Procedure(s) with comments: ESOPHAGOGASTRODUODENOSCOPY (EGD) WITH PROPOFOL (N/A) - 9:15am as a surgical intervention.  The patient's history has been reviewed, patient examined, no change in status, stable for surgery.  I have reviewed the patient's chart and labs.  Questions were answered to the patient's satisfaction.     Gaylord Seydel   No dysphagia.  No change.  Continued symptoms.  Discussed with patient and her guardian [name].  Diagnostic EGD today per plan.  The risks, benefits, limitations, alternatives and imponderables have been reviewed with the patient. Potential for esophageal dilation, biopsy, etc. have also been reviewed.  Questions have been answered. All parties agreeable.

## 2020-03-25 NOTE — Anesthesia Preprocedure Evaluation (Signed)
Anesthesia Evaluation  Patient identified by MRN, date of birth, ID band Patient awake    Reviewed: Allergy & Precautions, NPO status , [name] Chart, lab work & pertinent test results, reviewed documented beta blocker date and time   History of Anesthesia Complications Negative for: history of anesthetic complications  Airway Mallampati: III  TM Distance: >3 FB Neck ROM: Full    Dental no notable dental hx.    Pulmonary neg pulmonary ROS,    Pulmonary exam normal breath sounds clear to auscultation       Cardiovascular negative cardio ROS Normal cardiovascular exam Rhythm:Regular Rate:Normal     Neuro/Psych  Headaches, Anxiety Autistic    GI/Hepatic GERD  ,  Endo/Other  negative endocrine ROS  Renal/GU      Musculoskeletal   Abdominal   Peds  (+) mental retardation Hematology negative hematology ROS (+)   Anesthesia Other Findings   Reproductive/Obstetrics                             Anesthesia Physical Anesthesia Plan  ASA: II  Anesthesia Plan: General   Post-op Pain Management:    Induction: Intravenous  PONV Risk Score and Plan:   Airway Management Planned: Nasal Cannula  Additional Equipment:   Intra-op Plan:   Post-operative Plan:   Informed Consent: I have reviewed the patients History and Physical, chart, labs and discussed the procedure including the risks, benefits and alternatives for the proposed anesthesia with the patient or authorized representative who has indicated his/her understanding and acceptance.     Dental advisory given  Plan Discussed with: CRNA  Anesthesia Plan Comments:         Anesthesia Quick Evaluation

## 2020-03-25 NOTE — Discharge Instructions (Signed)
EGD Discharge instructions Please read the instructions outlined below and refer to this sheet in the next few weeks. These discharge instructions provide you with general information on caring for yourself after you leave the hospital. Your doctor may also give you specific instructions. While your treatment has been planned according to the most current medical practices available, unavoidable complications occasionally occur. If you have any problems or questions after discharge, please call your doctor. ACTIVITY  You may resume your regular activity but move at a slower pace for the next 24 hours.   Take frequent rest periods for the next 24 hours.   Walking will help expel (get rid of) the air and reduce the bloated feeling in your abdomen.   No driving for 24 hours (because of the anesthesia (medicine) used during the test).   You may shower.   Do not sign any important legal documents or operate any machinery for 24 hours (because of the anesthesia used during the test).  NUTRITION  Drink plenty of fluids.   You may resume your normal diet.   Begin with a light meal and progress to your normal diet.   Avoid alcoholic beverages for 24 hours or as instructed by your caregiver.  MEDICATIONS  You may resume your normal medications unless your caregiver tells you otherwise.  WHAT YOU CAN EXPECT TODAY  You may experience abdominal discomfort such as a feeling of fullness or "gas" pains.  FOLLOW-UP  Your doctor will discuss the results of your test with you.  SEEK IMMEDIATE MEDICAL ATTENTION IF ANY OF THE FOLLOWING OCCUR:  Excessive nausea (feeling sick to your stomach) and/or vomiting.   Severe abdominal pain and distention (swelling).   Trouble swallowing.   Temperature over 101 F (37.8 C).   Rectal bleeding or vomiting of blood.    You have reflux esophagitis or acid reflux bad enough to cause burns in your esophagus  GERD diet provided  Plan to lose 10 pounds  in the next 3 months  No more omeprazole/Prilosec  Begin begin Protonix 40 mg twice daily (before breakfast and supper  This visit with Wynne Dust in 3 months     Gastroesophageal Reflux Disease, Adult Gastroesophageal reflux (GER) happens when acid from the stomach flows up into the tube that connects the mouth and the stomach (esophagus). Normally, food travels down the esophagus and stays in the stomach to be digested. With GER, food and stomach acid sometimes move back up into the esophagus. You may have a disease called gastroesophageal reflux disease (GERD) if the reflux:  Happens often.  Causes frequent or very bad symptoms.  Causes problems such as damage to the esophagus. When this happens, the esophagus becomes sore and swollen (inflamed). Over time, GERD can make small holes (ulcers) in the lining of the esophagus. What are the causes? This condition is caused by a problem with the muscle between the esophagus and the stomach. When this muscle is weak or not normal, it does not close properly to keep food and acid from coming back up from the stomach. The muscle can be weak because of:  Tobacco use.  Pregnancy.  Having a certain type of hernia (hiatal hernia).  Alcohol use.  Certain foods and drinks, such as coffee, chocolate, onions, and peppermint. What increases the risk? You are more likely to develop this condition if you:  Are overweight.  Have a disease that affects your connective tissue.  Use NSAID medicines. What are the signs or symptoms? Symptoms  of this condition include:  Heartburn.  Difficult or painful swallowing.  The feeling of having a lump in the throat.  A bitter taste in the mouth.  Bad breath.  Having a lot of saliva.  Having an upset or bloated stomach.  Belching.  Chest pain. Different conditions can cause chest pain. Make sure you see your doctor if you have chest pain.  Shortness of breath or noisy breathing  (wheezing).  Ongoing (chronic) cough or a cough at night.  Wearing away of the surface of teeth (tooth enamel).  Weight loss. How is this treated? Treatment will depend on how bad your symptoms are. Your doctor may suggest:  Changes to your diet.  Medicine.  Surgery. Follow these instructions at home: Eating and drinking   Follow a diet as told by your doctor. You may need to avoid foods and drinks such as: ? Coffee and tea (with or without caffeine). ? Drinks that contain alcohol. ? Energy drinks and sports drinks. ? Bubbly (carbonated) drinks or sodas. ? Chocolate and cocoa. ? Peppermint and mint flavorings. ? Garlic and onions. ? Horseradish. ? Spicy and acidic foods. These include peppers, chili powder, curry powder, vinegar, hot sauces, and BBQ sauce. ? Citrus fruit juices and citrus fruits, such as oranges, lemons, and limes. ? Tomato-based foods. These include red sauce, chili, salsa, and pizza with red sauce. ? Fried and fatty foods. These include donuts, french fries, potato chips, and high-fat dressings. ? High-fat meats. These include hot dogs, rib eye steak, sausage, ham, and bacon. ? High-fat dairy items, such as whole milk, butter, and cream cheese.  Eat small meals often. Avoid eating large meals.  Avoid drinking large amounts of liquid with your meals.  Avoid eating meals during the 2-3 hours before bedtime.  Avoid lying down right after you eat.  Do not exercise right after you eat. Lifestyle   Do not use any products that contain nicotine or tobacco. These include cigarettes, e-cigarettes, and chewing tobacco. If you need help quitting, ask your doctor.  Try to lower your stress. If you need help doing this, ask your doctor.  If you are overweight, lose an amount of weight that is healthy for you. Ask your doctor about a safe weight loss goal. General instructions  Pay attention to any changes in your symptoms.  Take over-the-counter and  prescription medicines only as told by your doctor. Do not take aspirin, ibuprofen, or other NSAIDs unless your doctor says it is okay.  Wear loose clothes. Do not wear anything tight around your waist.  Raise (elevate) the head of your bed about 6 inches (15 cm).  Avoid bending over if this makes your symptoms worse.  Keep all follow-up visits as told by your doctor. This is important. Contact a doctor if:  You have new symptoms.  You lose weight and you do not know why.  You have trouble swallowing or it hurts to swallow.  You have wheezing or a cough that keeps happening.  Your symptoms do not get better with treatment.  You have a hoarse voice. Get help right away if:  You have pain in your arms, neck, jaw, teeth, or back.  You feel sweaty, dizzy, or light-headed.  You have chest pain or shortness of breath.  You throw up (vomit) and your throw-up looks like blood or coffee grounds.  You pass out (faint).  Your poop (stool) is bloody or black.  You cannot swallow, drink, or eat. Summary  If  a person has gastroesophageal reflux disease (GERD), food and stomach acid move back up into the esophagus and cause symptoms or problems such as damage to the esophagus.  Treatment will depend on how bad your symptoms are.  Follow a diet as told by your doctor.  Take all medicines only as told by your doctor. This information is not intended to replace advice given to you by your health care provider. Make sure you discuss any questions you have with your health care provider. Document Revised: 11/21/2017 Document Reviewed: 11/21/2017 Elsevier Patient Education  2020 [employer] Inc[employer]    Monitored Anesthesia Care, Care After These instructions provide you with information about caring for yourself after your procedure. Your health care provider may also give you more specific instructions. Your treatment has been planned according to current medical practices, but problems  sometimes occur. Call your health care provider if you have any problems or questions after your procedure. What can I expect after the procedure? After your procedure, you may:  Feel sleepy for several hours.  Feel clumsy and have poor balance for several hours.  Feel forgetful about what happened after the procedure.  Have poor judgment for several hours.  Feel nauseous or vomit.  Have a sore throat if you had a breathing tube during the procedure. Follow these instructions at home: For at least 24 hours after the procedure:      Have a responsible adult stay with you. It is important to have someone help care for you until you are awake and alert.  Rest as needed.  Do not: ? Participate in activities in which you could fall or become injured. ? Drive. ? Use heavy machinery. ? Drink alcohol. ? Take sleeping pills or medicines that cause drowsiness. ? Make important decisions or sign legal documents. ? Take care of children on your own. Eating and drinking  Follow the diet that is recommended by your health care provider.  If you vomit, drink water, juice, or soup when you can drink without vomiting.  Make sure you have little or no nausea before eating solid foods. General instructions  Take over-the-counter and prescription medicines only as told by your health care provider.  If you have sleep apnea, surgery and certain medicines can increase your risk for breathing problems. Follow instructions from your health care provider about wearing your sleep device: ? Anytime you are sleeping, including during daytime naps. ? While taking prescription pain medicines, sleeping medicines, or medicines that make you drowsy.  If you smoke, do not smoke without supervision.  Keep all follow-up visits as told by your health care provider. This is important. Contact a health care provider if:  You keep feeling nauseous or you keep vomiting.  You feel light-headed.  You  develop a rash.  You have a fever. Get help right away if:  You have trouble breathing. Summary  For several hours after your procedure, you may feel sleepy and have poor judgment.  Have a responsible adult stay with you for at least 24 hours or until you are awake and alert. This information is not intended to replace advice given to you by your health care provider. Make sure you discuss any questions you have with your health care provider. Document Revised: 08/13/2017 Document Reviewed: 09/05/2015 Elsevier Patient Education  2020 [employer].

## 2020-03-25 NOTE — Anesthesia Postprocedure Evaluation (Signed)
Anesthesia Post Note  Patient: Jennifer Higgins  Procedure(s) Performed: ESOPHAGOGASTRODUODENOSCOPY (EGD) WITH PROPOFOL (N/A )  Patient location during evaluation: Endoscopy Anesthesia Type: General Level of consciousness: awake and alert and oriented Pain management: pain level controlled Vital Signs Assessment: post-procedure vital signs reviewed and stable Respiratory status: spontaneous breathing, nonlabored ventilation and respiratory function stable Cardiovascular status: blood pressure returned to baseline Postop Assessment: no apparent nausea or vomiting Anesthetic complications: no   No complications documented.   Last Vitals:  Vitals:   03/25/20 0913 03/25/20 0918  BP: 111/74 116/85  Pulse: (!) 110 (!) 110  Resp: (!) 22 19  Temp: 36.4 C   SpO2: 95% 95%    Last Pain:  Vitals:   03/25/20 0918  TempSrc:   PainSc: 0-No pain                 Julian Reil

## 2020-03-30 ENCOUNTER — Encounter (HOSPITAL_COMMUNITY): Payer: Self-pay | Admitting: Internal Medicine

## 2020-05-30 ENCOUNTER — Other Ambulatory Visit (INDEPENDENT_AMBULATORY_CARE_PROVIDER_SITE_OTHER): Payer: Self-pay | Admitting: Internal Medicine

## 2020-05-30 DIAGNOSIS — F39 Unspecified mood [affective] disorder: Secondary | ICD-10-CM

## 2020-06-17 ENCOUNTER — Ambulatory Visit (INDEPENDENT_AMBULATORY_CARE_PROVIDER_SITE_OTHER): Payer: Medicare Other | Admitting: Internal Medicine

## 2020-06-29 ENCOUNTER — Encounter: Payer: Self-pay | Admitting: Nurse Practitioner

## 2020-06-29 ENCOUNTER — Encounter: Payer: Self-pay | Admitting: Internal Medicine

## 2020-06-29 ENCOUNTER — Ambulatory Visit (INDEPENDENT_AMBULATORY_CARE_PROVIDER_SITE_OTHER): Payer: Medicare Other | Admitting: Nurse Practitioner

## 2020-06-29 ENCOUNTER — Other Ambulatory Visit: Payer: Self-pay

## 2020-06-29 VITALS — BP 136/76 | HR 97 | Temp 97.3°F | Ht 64.0 in | Wt 188.4 lb

## 2020-06-29 DIAGNOSIS — R112 Nausea with vomiting, unspecified: Secondary | ICD-10-CM | POA: Diagnosis not present

## 2020-06-29 DIAGNOSIS — K219 Gastro-esophageal reflux disease without esophagitis: Secondary | ICD-10-CM | POA: Diagnosis not present

## 2020-06-29 DIAGNOSIS — R1084 Generalized abdominal pain: Secondary | ICD-10-CM

## 2020-06-29 NOTE — Patient Instructions (Signed)
Your health issues we discussed today were:   GERD (heartburn/reflux) with abdominal pain and nausea/vomiting: 1. I am glad you are doing better overall excavation point 2. I feel that in time your other remaining symptoms will slowly improve 3. In the meantime, continue taking Protonix 40 mg twice a day 4. Avoid all NSAIDs (ibuprofen, Motrin, Advil, Aleve, naproxen, Naprosyn, aspirin powders, Goody powders, anything with "NSAID" on the bottle) 5. You can use Tylenol as needed for headaches 6. Call us for any worsening or severe symptoms  Overall I recommend:  1. Continue other current medications 2. Return for follow-up in 6 months 3. Call us for any questions or concerns   ---------------------------------------------------------------  I am glad you have gotten your COVID-19 vaccination!  Even though you are fully vaccinated you should continue to follow CDC and state/local guidelines.  ---------------------------------------------------------------   At [facility] we value your feedback. You may receive a survey about your visit today. Please share your experience as we strive to create trusting relationships with our patients to provide genuine, compassionate, quality care.  We appreciate your understanding and patience as we review any laboratory studies, imaging, and other diagnostic tests that are ordered as we care for you. Our office policy is 5 business days for review of these results, and any emergent or urgent results are addressed in a timely manner for your best interest. If you do not hear from our office in 1 week, please contact us.   We also encourage the use of MyChart, which contains your medical information for your review as well. If you are not enrolled in this feature, an access code is on this after visit summary for your convenience. Thank you for allowing [place] to be involved in your care.  It was great to see you today!  I hope you have a safe  and warm winter!!
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
## 2020-07-13 ENCOUNTER — Other Ambulatory Visit: Payer: Self-pay | Admitting: Adult Health

## 2020-07-13 ENCOUNTER — Other Ambulatory Visit (INDEPENDENT_AMBULATORY_CARE_PROVIDER_SITE_OTHER): Payer: Self-pay | Admitting: Internal Medicine

## 2020-08-01 IMAGING — DX DG FOOT COMPLETE 3+V*R*
3 series · 3 of 3 positions shown · non-contrast
Comparison: None.

CLINICAL DATA: Pain over top of foot after syncopal event.

EXAM:
RIGHT FOOT COMPLETE - 3+ VIEW

[foot ap]
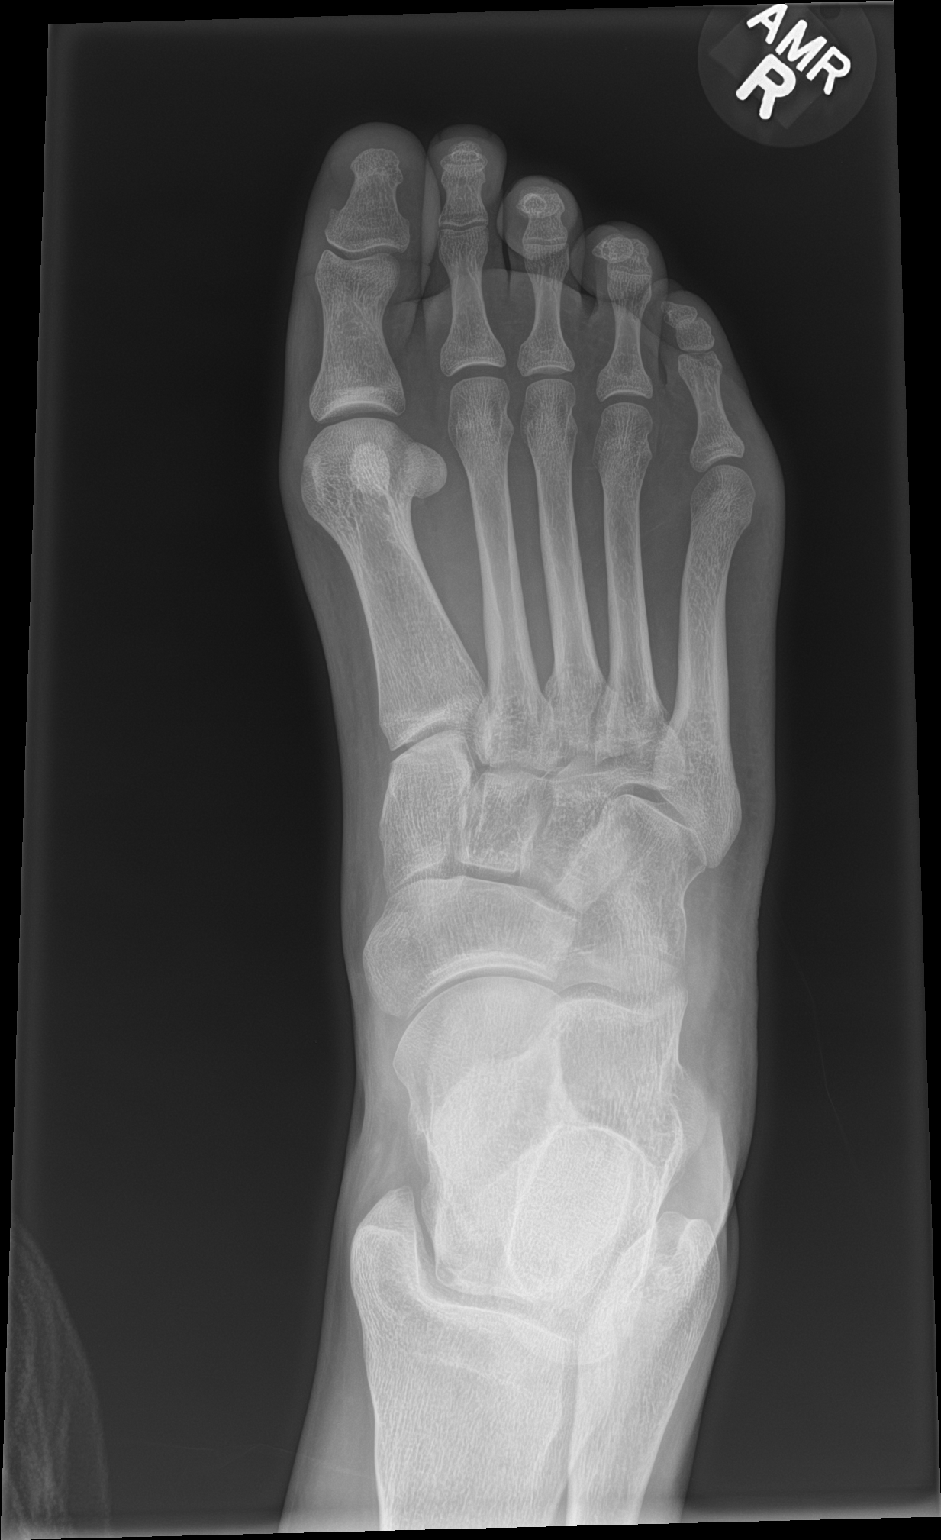

[foot obl]
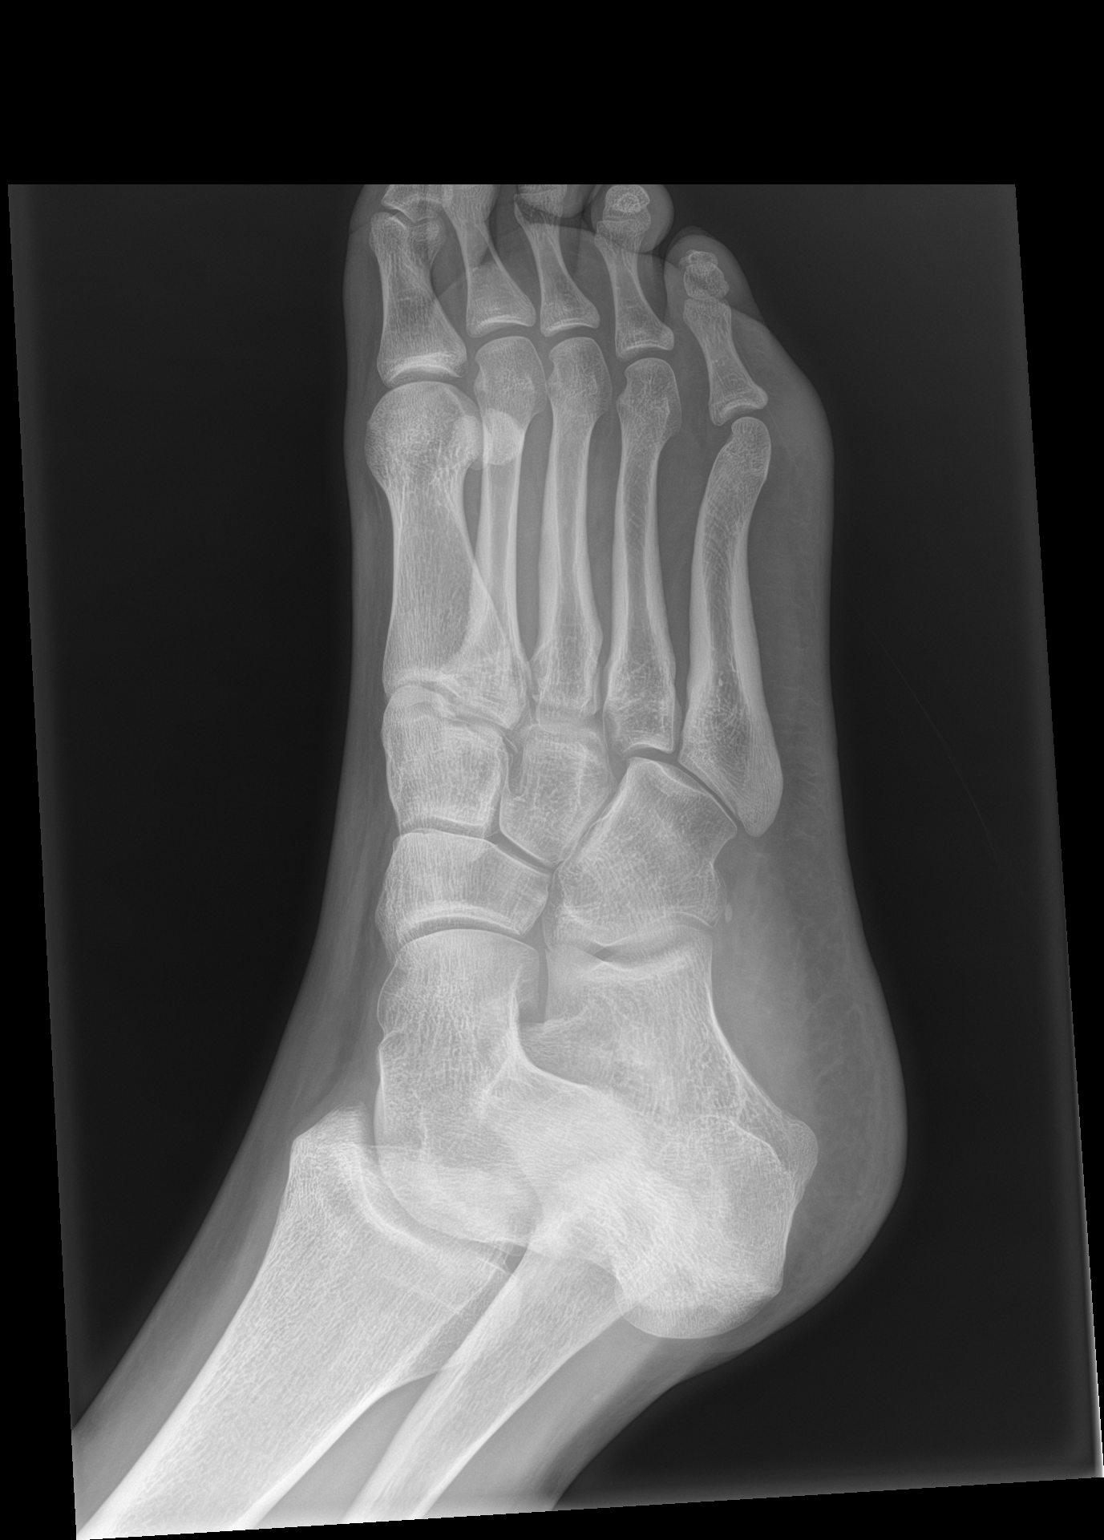

[foot lat]
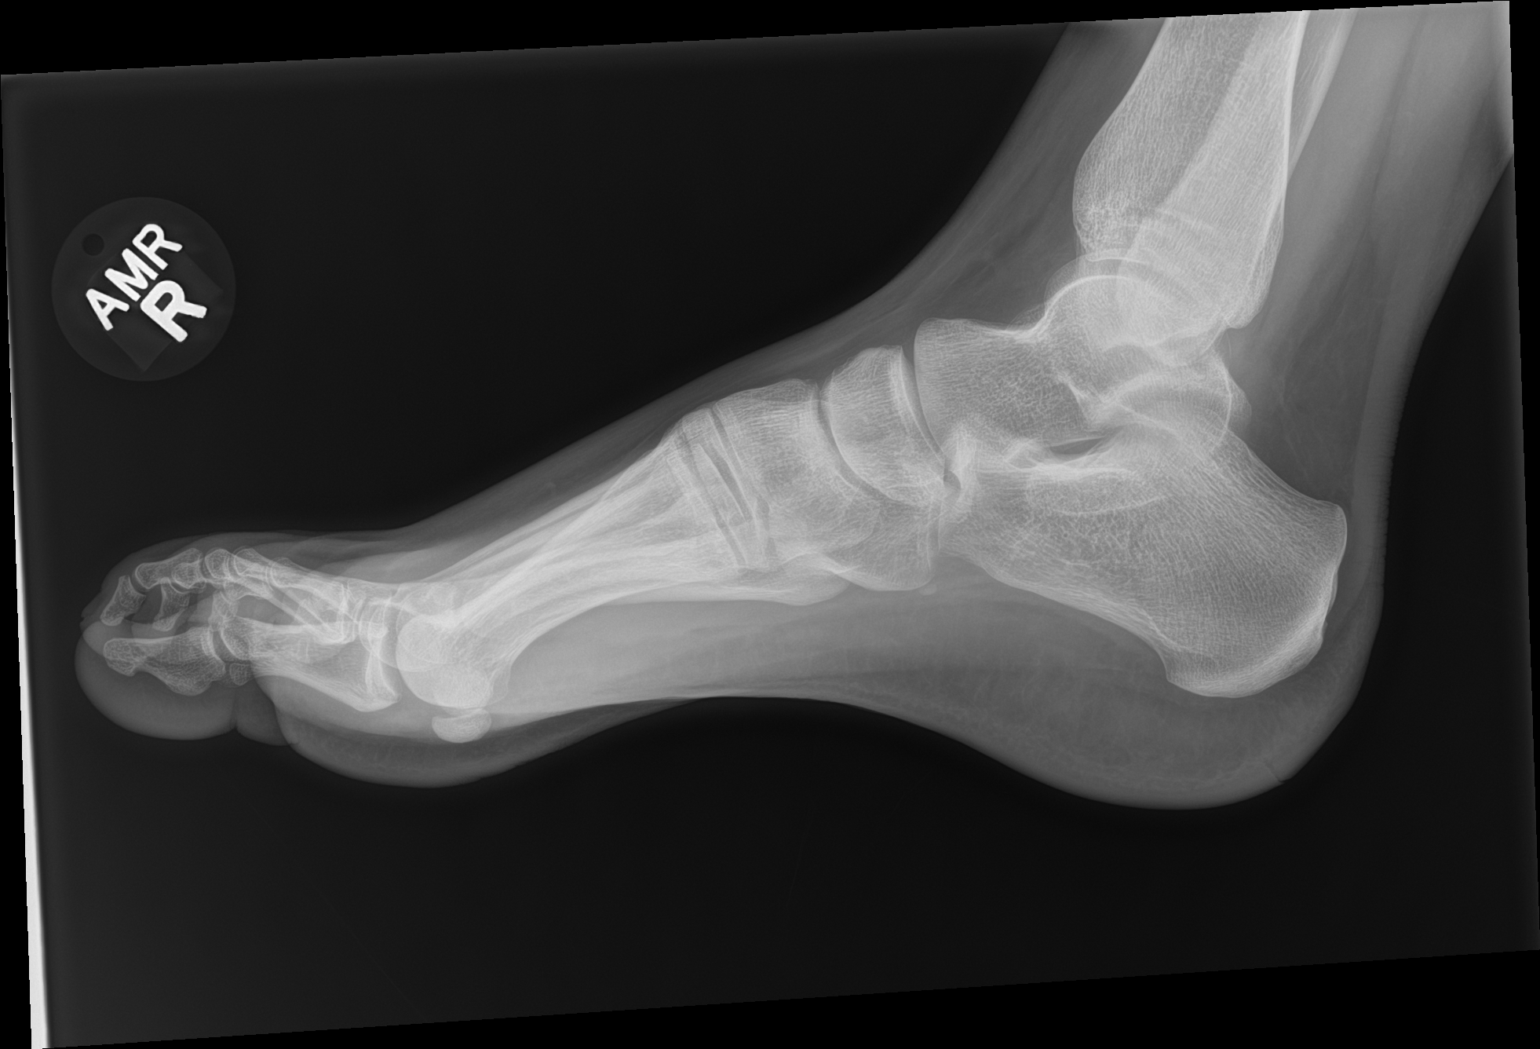

[3 of 3 positions shown; findings below may reference images not displayed]

FINDINGS: There is no evidence of fracture or dislocation. There is no
evidence of arthropathy or other focal bone abnormality. Soft
tissues are unremarkable.
IMPRESSION: Negative right foot radiographs.

## 2020-08-05 ENCOUNTER — Telehealth (INDEPENDENT_AMBULATORY_CARE_PROVIDER_SITE_OTHER): Payer: Medicare Other | Admitting: Nurse Practitioner

## 2020-08-05 ENCOUNTER — Encounter (INDEPENDENT_AMBULATORY_CARE_PROVIDER_SITE_OTHER): Payer: Self-pay | Admitting: Nurse Practitioner

## 2020-08-05 ENCOUNTER — Other Ambulatory Visit: Payer: Self-pay

## 2020-08-05 VITALS — Ht 64.0 in

## 2020-08-05 DIAGNOSIS — J029 Acute pharyngitis, unspecified: Secondary | ICD-10-CM

## 2020-08-05 DIAGNOSIS — E559 Vitamin D deficiency, unspecified: Secondary | ICD-10-CM | POA: Diagnosis not present

## 2020-08-05 DIAGNOSIS — E782 Mixed hyperlipidemia: Secondary | ICD-10-CM

## 2020-08-05 DIAGNOSIS — E669 Obesity, unspecified: Secondary | ICD-10-CM | POA: Diagnosis not present
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
## 2020-08-12 ENCOUNTER — Other Ambulatory Visit: Payer: Self-pay

## 2020-08-12 ENCOUNTER — Ambulatory Visit (INDEPENDENT_AMBULATORY_CARE_PROVIDER_SITE_OTHER): Payer: Medicare Other | Admitting: Neurology

## 2020-08-12 ENCOUNTER — Encounter: Payer: Self-pay | Admitting: Neurology

## 2020-08-12 VITALS — BP 114/77 | HR 90 | Resp 18 | Ht 64.0 in | Wt 188.0 lb

## 2020-08-12 DIAGNOSIS — G43009 Migraine without aura, not intractable, without status migrainosus: Secondary | ICD-10-CM | POA: Diagnosis not present

## 2020-08-12 MED ORDER — GABAPENTIN 300 MG PO CAPS
300.0000 mg | ORAL_CAPSULE | Freq: Every day | ORAL | 3 refills | Status: DC
Start: 2020-08-12 — End: 2021-05-18
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
## 2020-08-12 NOTE — Patient Instructions (Signed)
Good to see you! Continue gabapentin 300mg  every night. Follow-up in 6-8 months, call for any changes

## 2020-09-03 ENCOUNTER — Other Ambulatory Visit (INDEPENDENT_AMBULATORY_CARE_PROVIDER_SITE_OTHER): Payer: Self-pay | Admitting: Internal Medicine

## 2020-09-03 DIAGNOSIS — F39 Unspecified mood [affective] disorder: Secondary | ICD-10-CM

## 2020-09-13 ENCOUNTER — Encounter: Payer: Self-pay | Admitting: Gastroenterology

## 2020-10-02 IMAGING — US US ABDOMEN COMPLETE
1 series · 14 of 25 positions shown · non-contrast
Comparison: None.

CLINICAL DATA: Elevated LFT family history of Wilson's disease

EXAM:
ABDOMEN ULTRASOUND COMPLETE

[Series 1: us abdomen complete · 14 of 98 slices shown]
[im 1/98]
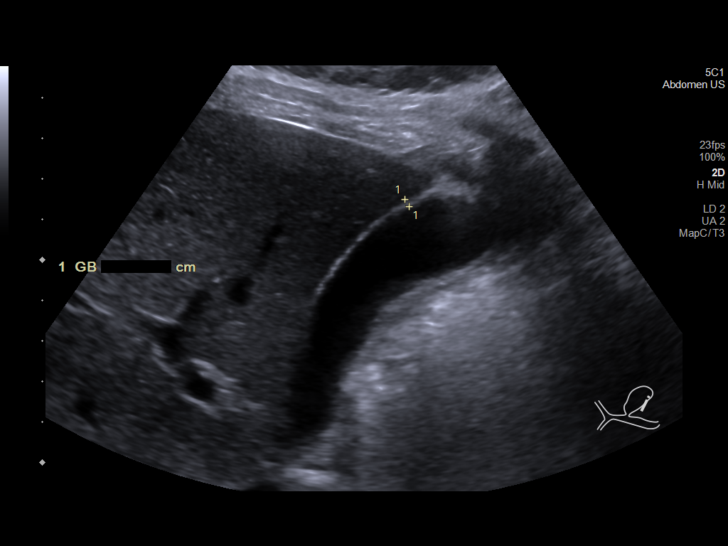
[im 9/98]
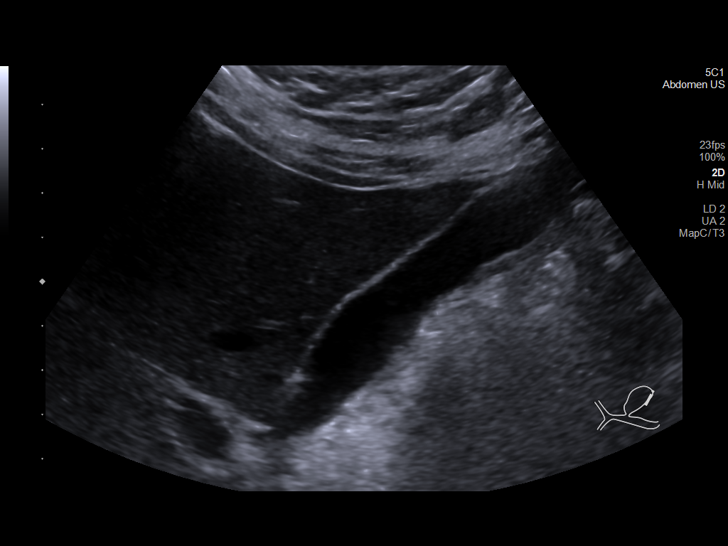
[im 17/98]
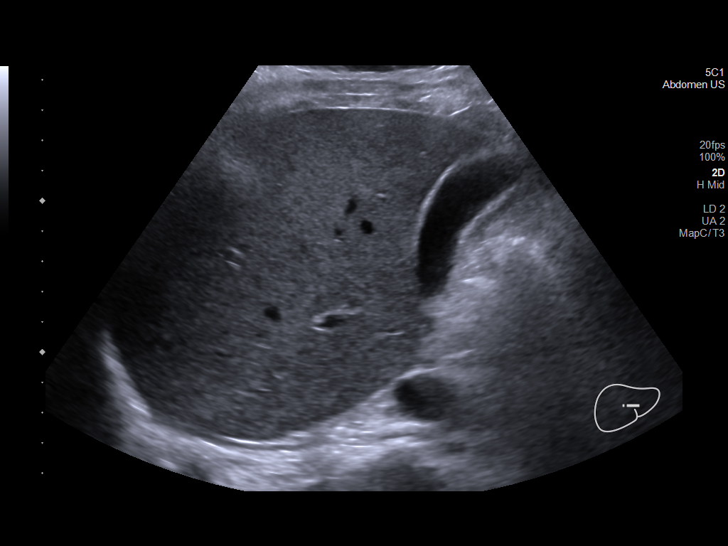
[im 25/98]
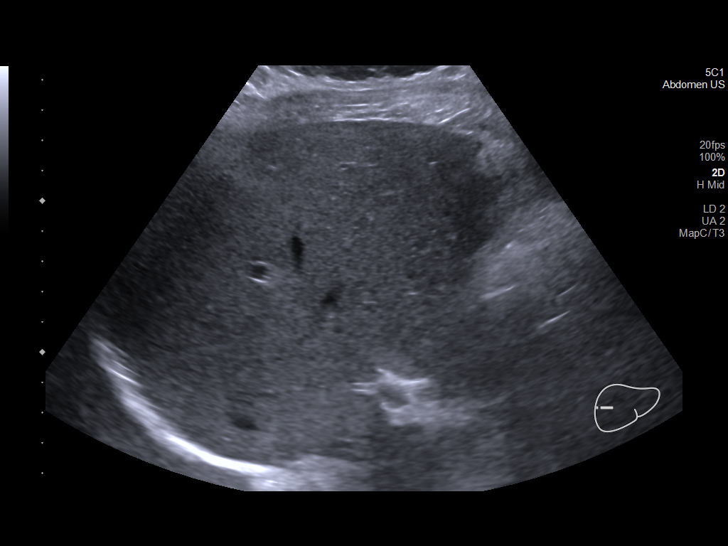
[im 33/98]
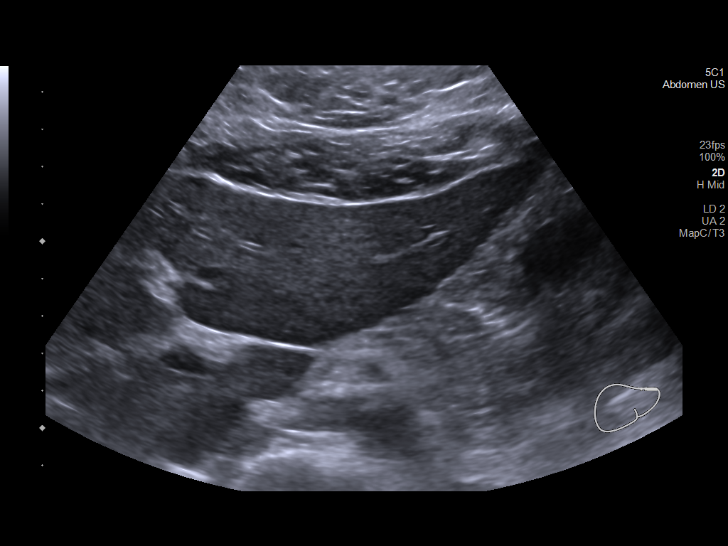
[im 37/98]
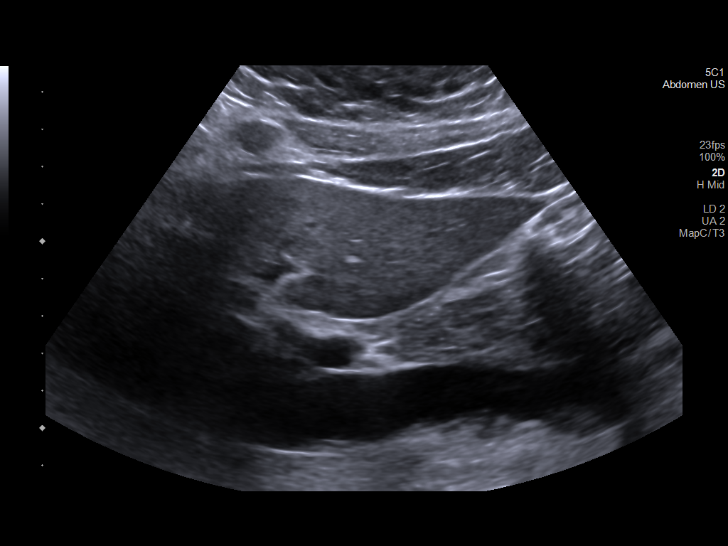
[im 45/98]
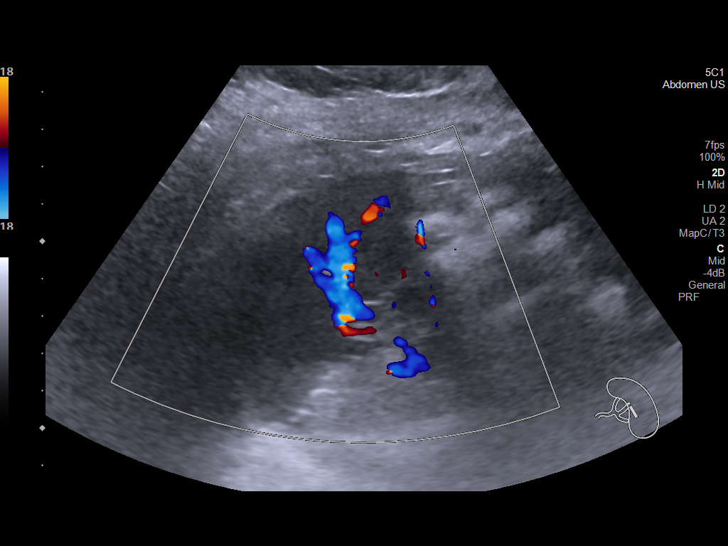
[im 53/98]
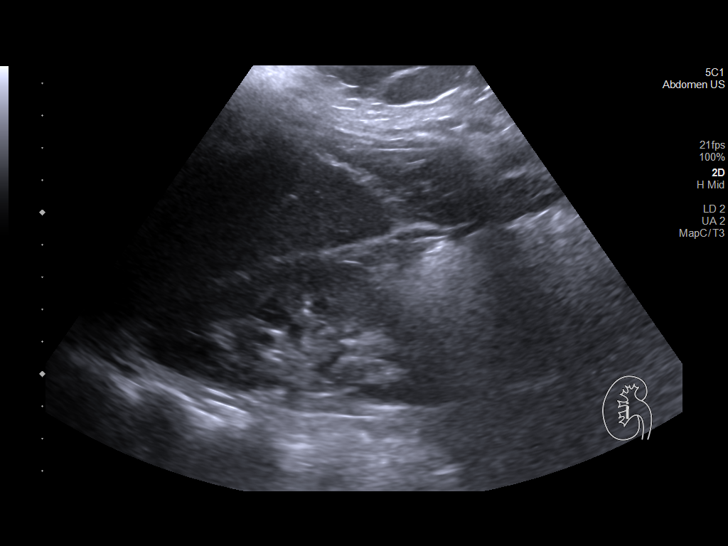
[im 61/98]
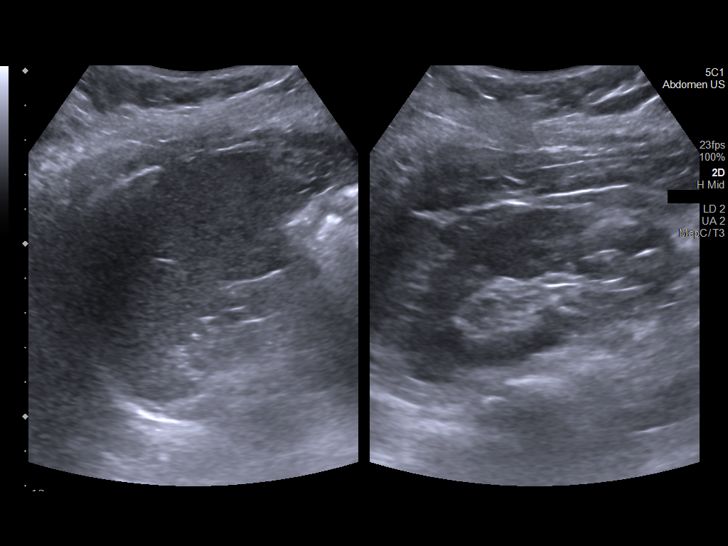
[im 65/98]
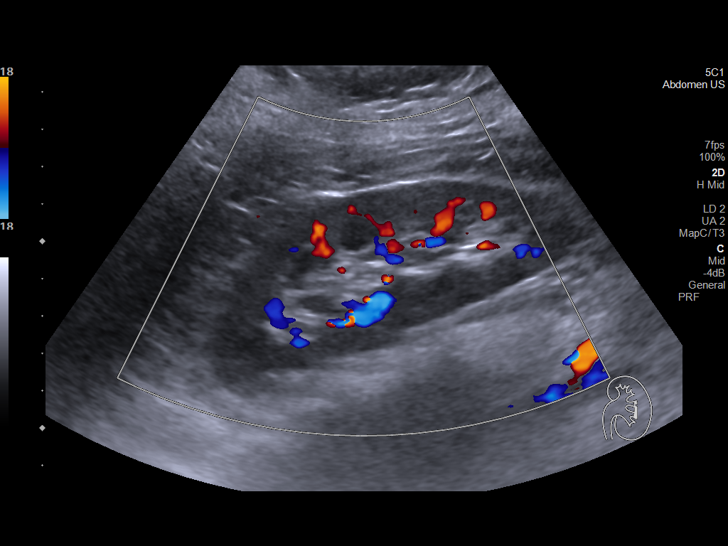
[im 73/98]
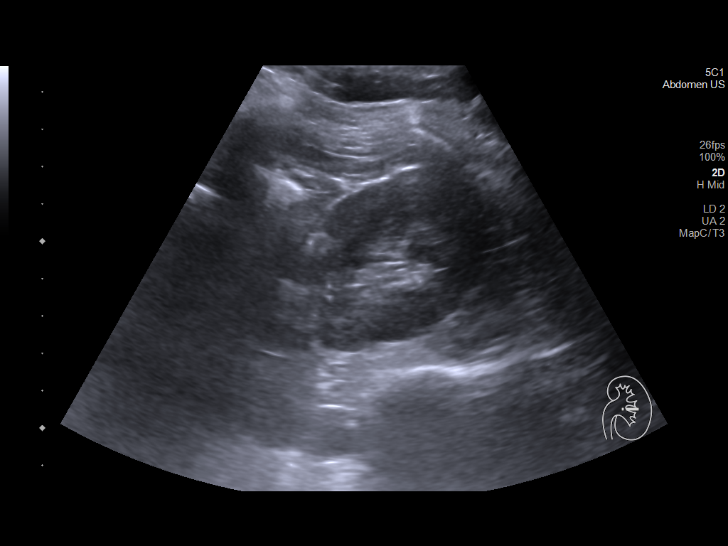
[im 81/98]
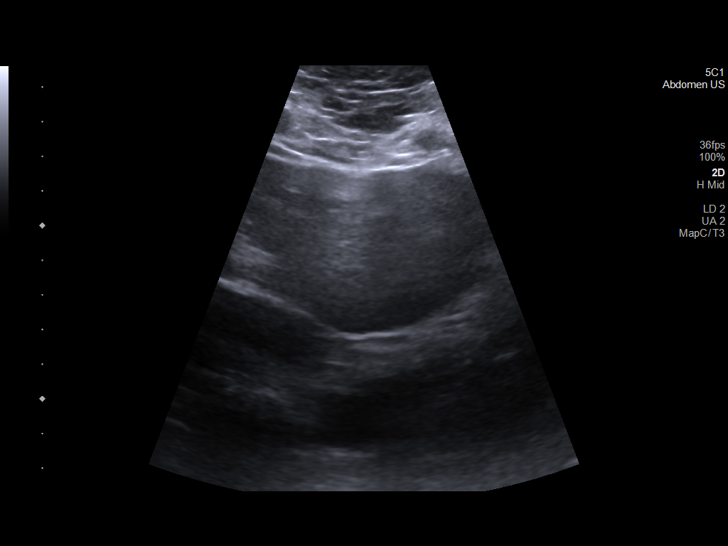
[im 89/98]
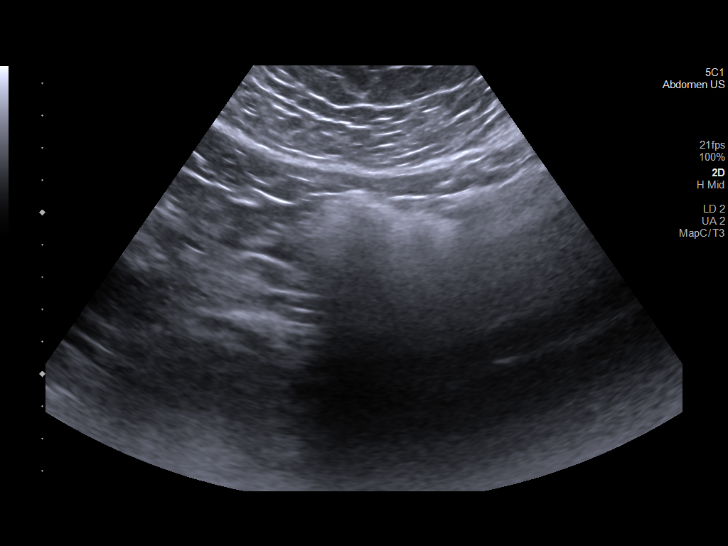
[im 98/98]
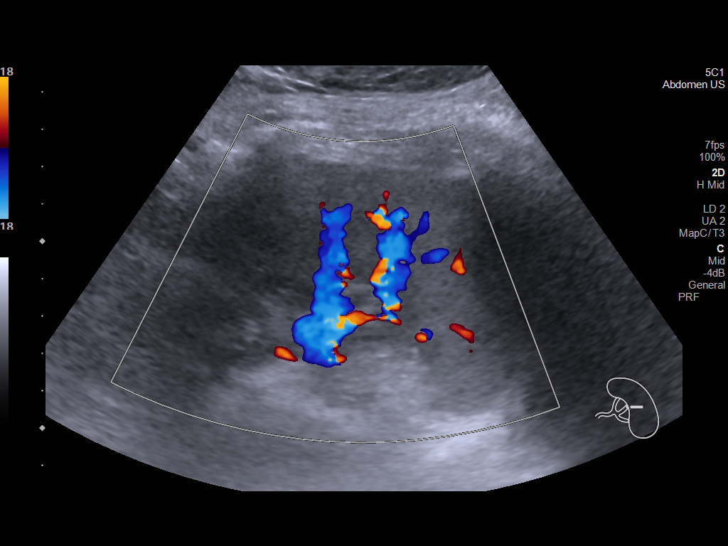

[14 of 25 positions shown; findings below may reference images not displayed]

FINDINGS: Gallbladder: No gallstones or wall thickening visualized. No
sonographic Murphy sign noted by sonographer.

Common bile duct: Diameter: 1.9 mm

Liver: Liver may be slightly echogenic. No focal hepatic
abnormality. Portal vein is patent on color Doppler imaging with
normal direction of blood flow towards the liver.

IVC: No abnormality visualized.

Pancreas: Visualized portion unremarkable.

Spleen: Size and appearance within normal limits.

Right Kidney: Length: 11 cm. Echogenicity within normal limits. No
mass or hydronephrosis visualized.

Left Kidney: Length: 10.6 cm. Echogenicity within normal limits. No
mass or hydronephrosis visualized.

Abdominal aorta: No aneurysm visualized.

Other findings: None.
IMPRESSION: 1. Liver may be slightly echogenic as may be seen with steatosis.
2. Otherwise negative abdominal ultrasound

## 2020-10-27 ENCOUNTER — Other Ambulatory Visit: Payer: Self-pay | Admitting: Adult Health

## 2020-10-28 ENCOUNTER — Other Ambulatory Visit (INDEPENDENT_AMBULATORY_CARE_PROVIDER_SITE_OTHER): Payer: Self-pay | Admitting: Internal Medicine

## 2020-10-28 DIAGNOSIS — F39 Unspecified mood [affective] disorder: Secondary | ICD-10-CM

## 2020-11-08 ENCOUNTER — Ambulatory Visit (INDEPENDENT_AMBULATORY_CARE_PROVIDER_SITE_OTHER): Payer: Medicare Other | Admitting: Internal Medicine

## 2020-11-18 ENCOUNTER — Other Ambulatory Visit (INDEPENDENT_AMBULATORY_CARE_PROVIDER_SITE_OTHER): Payer: Self-pay | Admitting: Internal Medicine

## 2020-11-18 ENCOUNTER — Other Ambulatory Visit: Payer: Self-pay

## 2020-11-18 ENCOUNTER — Ambulatory Visit (INDEPENDENT_AMBULATORY_CARE_PROVIDER_SITE_OTHER): Payer: Medicare Other | Admitting: Internal Medicine

## 2020-11-18 ENCOUNTER — Encounter (INDEPENDENT_AMBULATORY_CARE_PROVIDER_SITE_OTHER): Payer: Self-pay | Admitting: Internal Medicine

## 2020-11-18 VITALS — BP 120/72 | HR 75 | Temp 98.3°F | Resp 18 | Ht 64.0 in | Wt 187.2 lb

## 2020-11-18 DIAGNOSIS — E782 Mixed hyperlipidemia: Secondary | ICD-10-CM

## 2020-11-18 DIAGNOSIS — E669 Obesity, unspecified: Secondary | ICD-10-CM | POA: Diagnosis not present

## 2020-11-18 DIAGNOSIS — R748 Abnormal levels of other serum enzymes: Secondary | ICD-10-CM | POA: Diagnosis not present
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
## 2020-11-19 LAB — COMPLETE METABOLIC PANEL WITH GFR
AG Ratio: 1.3 (calc) (ref 1.0–2.5)
ALT: 24 U/L (ref 6–29)
AST: 43 U/L — ABNORMAL HIGH (ref 10–30)
Albumin: 4.1 g/dL (ref 3.6–5.1)
Alkaline phosphatase (APISO): 66 U/L (ref 31–125)
BUN/Creatinine Ratio: 4 (calc) — ABNORMAL LOW (ref 6–22)
BUN: 4 mg/dL — ABNORMAL LOW (ref 7–25)
CO2: 23 mmol/L (ref 20–32)
Calcium: 9.1 mg/dL (ref 8.6–10.2)
Chloride: 104 mmol/L (ref 98–110)
Creat: 0.91 mg/dL (ref 0.50–1.10)
GFR, Est African American: 103 mL/min/{1.73_m2} (ref >=60)
GFR, Est Non African American: 89 mL/min/{1.73_m2} (ref >=60)
Globulin: 3.1 g/dL (calc) (ref 1.9–3.7)
Glucose, Bld: 80 mg/dL (ref 65–99)
Potassium: 4.4 mmol/L (ref 3.5–5.3)
Sodium: 139 mmol/L (ref 135–146)
Total Bilirubin: 0.4 mg/dL (ref 0.2–1.2)
Total Protein: 7.2 g/dL (ref 6.1–8.1)

## 2020-11-19 LAB — LIPID PANEL
Cholesterol: 244 mg/dL — ABNORMAL HIGH (ref <200)
HDL: 40 mg/dL — ABNORMAL LOW (ref >=50)
LDL Cholesterol (Calc): 166 mg/dL (calc) — ABNORMAL HIGH
Non-HDL Cholesterol (Calc): 204 mg/dL (calc) — ABNORMAL HIGH (ref <130)
Total CHOL/HDL Ratio: 6.1 (calc) — ABNORMAL HIGH (ref <5.0)
Triglycerides: 215 mg/dL — ABNORMAL HIGH (ref <150)

## 2020-12-08 ENCOUNTER — Other Ambulatory Visit (INDEPENDENT_AMBULATORY_CARE_PROVIDER_SITE_OTHER): Payer: Self-pay | Admitting: Internal Medicine

## 2020-12-08 DIAGNOSIS — F39 Unspecified mood [affective] disorder: Secondary | ICD-10-CM

## 2020-12-28 ENCOUNTER — Ambulatory Visit: Payer: Medicare Other | Admitting: Nurse Practitioner

## 2020-12-28 ENCOUNTER — Ambulatory Visit: Payer: Medicare Other | Admitting: Gastroenterology

## 2020-12-28 ENCOUNTER — Telehealth (INDEPENDENT_AMBULATORY_CARE_PROVIDER_SITE_OTHER): Payer: Self-pay

## 2020-12-28 NOTE — Telephone Encounter (Signed)
Santina Evans left a detailed voice message that patient tested Positive for Covid yesterday.  I called Santina Evans back and she stated that the patient has had a sore throat for a few days and everything tastes funny to her and she is tired and not hungry and her daughter has tested Positive also. Can something be called into [facility] for her?

## 2020-12-28 NOTE — Telephone Encounter (Signed)
Called patient and spoke to [place] and gave her the message. Santina Evans will try to do a mychart visit and if she has any trouble she will call us back and do a virtual visit with [place].

## 2020-12-29 ENCOUNTER — Telehealth (INDEPENDENT_AMBULATORY_CARE_PROVIDER_SITE_OTHER): Payer: Medicare Other | Admitting: Nurse Practitioner

## 2020-12-29 ENCOUNTER — Telehealth (INDEPENDENT_AMBULATORY_CARE_PROVIDER_SITE_OTHER): Payer: Self-pay | Admitting: Nurse Practitioner

## 2020-12-29 DIAGNOSIS — U071 COVID-19: Secondary | ICD-10-CM | POA: Diagnosis not present

## 2020-12-29 MED ORDER — MOLNUPIRAVIR EUA 200MG CAPSULE: 4.0000 | ORAL_CAPSULE | Freq: Two times a day (BID) | ORAL | 0 refills | Status: AC
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
## 2021-01-06 NOTE — Telephone Encounter (Signed)
done

## 2021-01-25 ENCOUNTER — Ambulatory Visit (INDEPENDENT_AMBULATORY_CARE_PROVIDER_SITE_OTHER): Payer: Medicare Other | Admitting: Cardiology

## 2021-01-25 ENCOUNTER — Other Ambulatory Visit: Payer: Self-pay

## 2021-01-25 ENCOUNTER — Encounter: Payer: Self-pay | Admitting: Cardiology

## 2021-01-25 VITALS — BP 114/72 | HR 96 | Ht 64.0 in | Wt 184.0 lb

## 2021-01-25 DIAGNOSIS — E782 Mixed hyperlipidemia: Secondary | ICD-10-CM | POA: Diagnosis not present

## 2021-01-25 MED ORDER — ROSUVASTATIN CALCIUM 5 MG PO TABS: 5.0000 mg | ORAL_TABLET | Freq: Every day | ORAL | 3 refills | Status: DC
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
## 2021-01-25 NOTE — Patient Instructions (Signed)
Medication Instructions:  Your physician has recommended you make the following change in your medication:  STOP ATORVASTATIN START CRESTOR 5 mg tablets daily  *If you need a refill on your cardiac medications before your next appointment, please call your pharmacy*   Lab Work: 2 Months: CMET FASTING LIPID PANEL If you have labs (blood work) drawn today and your tests are completely normal, you will receive your results only by: MyChart Message (if you have MyChart) OR A paper copy in the mail If you have any lab test that is abnormal or we need to change your treatment, we will call you to review the results.   Testing/Procedures: None   Follow-Up: At [facility], you and your health needs are our priority.  As part of our continuing mission to provide you with exceptional heart care, we have created designated Provider Care Teams.  These Care Teams include your primary Cardiologist (physician) and Advanced Practice Providers (APPs -  Physician Assistants and Nurse Practitioners) who all work together to provide you with the care you need, when you need it.  We recommend signing up for the patient portal called "MyChart".  Sign up information is provided on this After Visit Summary.  MyChart is used to connect with patients for Virtual Visits (Telemedicine).  Patients are able to view lab/test results, encounter notes, upcoming appointments, etc.  Non-urgent messages can be sent to your provider as well.   To learn more about what you can do with MyChart, go to [email or web address].    Your next appointment:   6 month(s)  The format for your next appointment:   In Person  Provider:   Dina Rich, MD   Other Instructions

## 2021-01-26 ENCOUNTER — Other Ambulatory Visit: Payer: Self-pay | Admitting: Adult Health

## 2021-02-14 ENCOUNTER — Other Ambulatory Visit: Payer: Self-pay

## 2021-02-14 ENCOUNTER — Ambulatory Visit (INDEPENDENT_AMBULATORY_CARE_PROVIDER_SITE_OTHER): Payer: Medicare Other | Admitting: Family Medicine

## 2021-02-14 ENCOUNTER — Encounter: Payer: Self-pay | Admitting: Family Medicine

## 2021-02-14 VITALS — BP 116/77 | HR 94 | Temp 98.0°F | Ht 64.0 in | Wt 183.4 lb

## 2021-02-14 DIAGNOSIS — Z8659 Personal history of other mental and behavioral disorders: Secondary | ICD-10-CM | POA: Diagnosis not present

## 2021-02-14 DIAGNOSIS — K2100 Gastro-esophageal reflux disease with esophagitis, without bleeding: Secondary | ICD-10-CM | POA: Diagnosis not present

## 2021-02-14 DIAGNOSIS — F840 Autistic disorder: Secondary | ICD-10-CM

## 2021-02-14 DIAGNOSIS — E782 Mixed hyperlipidemia: Secondary | ICD-10-CM | POA: Diagnosis not present

## 2021-02-14 DIAGNOSIS — Z23 Encounter for immunization: Secondary | ICD-10-CM | POA: Diagnosis not present

## 2021-02-14 DIAGNOSIS — F39 Unspecified mood [affective] disorder: Secondary | ICD-10-CM | POA: Diagnosis not present

## 2021-02-14 MED ORDER — VORTIOXETINE HBR 20 MG PO TABS: 20.0000 mg | ORAL_TABLET | Freq: Every day | ORAL | 0 refills | Status: DC

## 2021-02-14 NOTE — Patient Instructions (Signed)
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
## 2021-03-08 ENCOUNTER — Telehealth: Payer: Self-pay | Admitting: Family Medicine

## 2021-03-08 NOTE — Telephone Encounter (Signed)
Medication has not been filled by our office  Please advise

## 2021-03-08 NOTE — Telephone Encounter (Signed)
  Prescription Request  03/08/2021  Is this a "Controlled Substance" medicine? no Have you seen your PCP in the last 2 weeks? 02/14/2021 and has apt 04/13/2021 If YES, route message to pool  -  If NO, patient needs to be scheduled for appointment.  What is the name of the medication or equipment?cloNIDine (CATAPRES) 0.1 MG tablet  Have you contacted your pharmacy to request a refill? No  Which pharmacy would you like this sent to? [facility]   Patient notified that their request is being sent to the clinical staff for review and that they should receive a response within 2 business days.

## 2021-03-09 ENCOUNTER — Other Ambulatory Visit: Payer: Self-pay | Admitting: Family Medicine

## 2021-03-09 DIAGNOSIS — F39 Unspecified mood [affective] disorder: Secondary | ICD-10-CM

## 2021-03-09 MED ORDER — CLONIDINE HCL 0.1 MG PO TABS: 0.1000 mg | ORAL_TABLET | Freq: Every day | ORAL | 1 refills | Status: DC

## 2021-03-09 NOTE — Telephone Encounter (Signed)
Refill sent in

## 2021-03-14 ENCOUNTER — Encounter: Payer: Self-pay | Admitting: Family Medicine

## 2021-03-14 ENCOUNTER — Encounter: Payer: Medicare Other | Admitting: Family Medicine

## 2021-03-14 NOTE — Progress Notes (Signed)
Called multiple times and left a voicemail. Unable to reach anyone. Closing encounter.

## 2021-03-17 ENCOUNTER — Other Ambulatory Visit (HOSPITAL_COMMUNITY)
Admission: RE | Admit: 2021-03-17 | Discharge: 2021-03-17 | Disposition: A | Discharge status: Home / Self Care | Payer: Medicare Other | Source: Ambulatory Visit | Attending: Adult Health | Admitting: Adult Health

## 2021-03-17 ENCOUNTER — Other Ambulatory Visit: Payer: Self-pay

## 2021-03-17 ENCOUNTER — Encounter: Payer: Self-pay | Admitting: Adult Health

## 2021-03-17 ENCOUNTER — Ambulatory Visit (INDEPENDENT_AMBULATORY_CARE_PROVIDER_SITE_OTHER): Payer: Medicare Other | Admitting: Adult Health

## 2021-03-17 VITALS — BP 113/80 | HR 101 | Ht 65.0 in | Wt 184.0 lb

## 2021-03-17 DIAGNOSIS — N898 Other specified noninflammatory disorders of vagina: Secondary | ICD-10-CM | POA: Insufficient documentation

## 2021-03-17 DIAGNOSIS — B3731 Acute candidiasis of vulva and vagina: Secondary | ICD-10-CM | POA: Insufficient documentation

## 2021-03-17 HISTORY — DX: Other specified noninflammatory disorders of vagina: N89.8

## 2021-03-17 MED ORDER — FLUCONAZOLE 150 MG PO TABS: ORAL_TABLET | ORAL | 1 refills | Status: DC

## 2021-03-17 MED ORDER — NYSTATIN 100000 UNIT/GM EX CREA: 1.0000 "application " | TOPICAL_CREAM | Freq: Two times a day (BID) | CUTANEOUS | 1 refills | Status: DC

## 2021-03-17 NOTE — Progress Notes (Signed)
  Subjective:     Patient ID: Jennifer Higgins, female   DOB: 06/29/1995, 25 y.o.   MRN: [id number]  HPI Jennifer Higgins is a 25 year old white female,single, G0P0, on autism spectrum in complaining of vaginal itching and discharge. PCP is Jennifer Mares NP  Lab Results  Component Value Date   DIAGPAP  10/16/2018    NEGATIVE FOR INTRAEPITHELIAL LESIONS OR MALIGNANCY.    Review of Systems Vaginal itching and discharge Has never had sex  Reviewed past medical,surgical, social and family history. Reviewed medications and allergies.     Objective:   Physical Exam BP 113/80 (BP Location: Left Arm, Patient Position: Sitting, Cuff Size: Normal)   Pulse (!) 101   Ht 5\' 5"  (1.651 m)   Wt 184 lb (83.5 kg)   BMI 30.62 kg/m  Skin warm and dry.Pelvic: external genitalia is red in creases and has white discharge, vagina: white discharge without odor, CV swab obtained,blind sweep. Examination chaperoned by LPN Fall risk is low  Upstream - 03/17/21 0939       Pregnancy Intention Screening   Does the patient want to become pregnant in the next year? No    Does the patient's partner want to become pregnant in the next year? No    Would the patient like to discuss contraceptive options today? No      Contraception Wrap Up   Current Method Oral Contraceptive    End Method Oral Contraceptive    Contraception Counseling Provided No                Assessment:     1. Vaginal itching CV swab sent   2. Vaginal discharge CV swab sent for GC/CHL,trich,BV and yeast   3. Yeast vaginitis Will rx diflucan and nystatin cream  Meds ordered this encounter  Medications   fluconazole (DIFLUCAN) 150 MG tablet    Sig: Take 1 now and 1 in 3 days    Dispense:  2 tablet    Refill:  1    Order Specific Question:   Supervising Provider    Answer:   03/19/21 H [[id number]]   nystatin cream (MYCOSTATIN)    Sig: Apply 1 application topically 2 (two) times daily.    Dispense:  30 g    Refill:   1    Order Specific Question:   Supervising Provider    Answer:   Duane Lope       Plan:     Return Oct 18, 2021 for pap Physical with PCP

## 2021-03-21 LAB — CERVICOVAGINAL ANCILLARY ONLY
Bacterial Vaginitis (gardnerella): NEGATIVE
Candida Glabrata: NEGATIVE
Candida Vaginitis: POSITIVE — AB
Chlamydia: NEGATIVE
Comment: NEGATIVE
Comment: NEGATIVE
Comment: NEGATIVE
Comment: NEGATIVE
Comment: NEGATIVE
Comment: NORMAL
Neisseria Gonorrhea: NEGATIVE
Trichomonas: NEGATIVE

## 2021-04-07 ENCOUNTER — Other Ambulatory Visit: Payer: Self-pay | Admitting: Internal Medicine

## 2021-04-13 ENCOUNTER — Ambulatory Visit (INDEPENDENT_AMBULATORY_CARE_PROVIDER_SITE_OTHER): Payer: Medicare Other | Admitting: Family Medicine

## 2021-04-13 ENCOUNTER — Other Ambulatory Visit: Payer: Self-pay

## 2021-04-13 ENCOUNTER — Encounter: Payer: Self-pay | Admitting: Family Medicine

## 2021-04-13 VITALS — BP 114/79 | HR 99 | Temp 97.9°F | Ht 65.0 in | Wt 185.0 lb

## 2021-04-13 DIAGNOSIS — F39 Unspecified mood [affective] disorder: Secondary | ICD-10-CM | POA: Diagnosis not present

## 2021-04-13 DIAGNOSIS — F840 Autistic disorder: Secondary | ICD-10-CM

## 2021-04-13 MED ORDER — VORTIOXETINE HBR 20 MG PO TABS: 20.0000 mg | ORAL_TABLET | Freq: Every day | ORAL | 0 refills | Status: DC

## 2021-04-13 MED ORDER — AMANTADINE HCL 100 MG PO CAPS: 100.0000 mg | ORAL_CAPSULE | Freq: Two times a day (BID) | ORAL | 1 refills | Status: DC

## 2021-04-13 NOTE — Patient Instructions (Addendum)
Call [facility]  [address] [place], [place] [phone]
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
## 2021-04-27 ENCOUNTER — Encounter: Payer: Self-pay | Admitting: Gastroenterology

## 2021-04-27 ENCOUNTER — Ambulatory Visit (INDEPENDENT_AMBULATORY_CARE_PROVIDER_SITE_OTHER): Payer: Medicare Other | Admitting: Gastroenterology

## 2021-04-27 ENCOUNTER — Other Ambulatory Visit: Payer: Self-pay

## 2021-04-27 VITALS — BP 115/76 | HR 94 | Temp 97.5°F | Ht 65.0 in | Wt 184.6 lb

## 2021-04-27 DIAGNOSIS — K219 Gastro-esophageal reflux disease without esophagitis: Secondary | ICD-10-CM | POA: Diagnosis not present

## 2021-04-27 DIAGNOSIS — K760 Fatty (change of) liver, not elsewhere classified: Secondary | ICD-10-CM | POA: Diagnosis not present

## 2021-04-27 DIAGNOSIS — R7401 Elevation of levels of liver transaminase levels: Secondary | ICD-10-CM | POA: Diagnosis not present

## 2021-04-27 NOTE — Patient Instructions (Signed)
Continue pantoprazole ONCE daily.  Great job at watching your [employer] and changing your diet. Overall you are down about 9 pounds in the past one year. This is very important for your overall health and for your liver! Please call if you have any changes in your abdominal pain, have vomiting, or constipation.  We will see you back in six months for a check up.

## 2021-04-27 NOTE — Progress Notes (Signed)
Primary Care Physician: Gabriel Earing, FNP  Primary Gastroenterologist:  Roetta Sessions, MD   Chief Complaint  Patient presents with   Gastroesophageal Reflux    Doing ok    HPI: Jennifer Higgins is a 25 y.o. female here for follow-up.  Last seen in February 2022.  She has a history of GERD, constipation, family history of Wilson's disease.  Patient reportedly tested as a child for Wilson disease and was negative, they are unable to locate records.  In August 2021, 24-hour urinary copper was low at 9, ceruloplasmin 40. She completed eye exam earlier this year which was negative for Cecille Aver rings per her social worker who took her for the exam.   She seems to be doing better. No further vomiting. She has only been taking pantoprazole at night time, often misses morning dose. She no longer has heartburn or throat burning. She has changed her diet, eating smaller portions, eating healthier. Her overall weight has dropped 9 pounds in the past one year. Her lipid panel remains elevated, on crestor. New PCP at [facility], since death of her previous PCP. She does not do well with change. Her BMs seem to be regular. She reports rare solid stool. Unable to quantify how many stools a day but denies nocturnal diarrhea. Social worker does not believe she is having too frequent of stools as [employer] she lives with has not reported any concerns. She continues to have nonspecific generalized abdominal pain off/on. Not always meal related or associated with stools. But overall she reports she feels a lot better. Denies melena, brbpr.   She has had minimally elevated AST, ALT intermittently.  LFTs in June 2022 with AST of 43.  EGD October 2021: -Ulcerative/erosive reflux esophagitis with patulous EG junction -Small hiatal hernia  Current Outpatient Medications  Medication Sig Dispense Refill   amantadine (SYMMETREL) 100 MG capsule Take 1 capsule (100 mg total) by mouth 2 (two)  times daily. 180 capsule 1   CAMRESE 0.15-0.03 &0.01 MG tablet TAKE 1 TABLET BY MOUTH DAILY 91 tablet 0   Cholecalciferol (VITAMIN D3 PO) Take 5,000 Units by mouth daily.      cloNIDine (CATAPRES) 0.1 MG tablet Take 1 tablet (0.1 mg total) by mouth at bedtime. 90 tablet 1   DENTA 5000 PLUS 1.1 % CREA dental cream      gabapentin (NEURONTIN) 300 MG capsule Take 1 capsule (300 mg total) by mouth at bedtime. 90 capsule 3   Omega-3 1000 MG CAPS Take by mouth daily.      pantoprazole (PROTONIX) 40 MG tablet TAKE 1 TABLET BY MOUTH 2 TIMES DAILY. 60 tablet 5   rosuvastatin (CRESTOR) 5 MG tablet Take 1 tablet (5 mg total) by mouth daily. 90 tablet 3   senna (SENOKOT) 8.6 MG tablet Take 1 tablet by mouth as needed.      vortioxetine HBr (TRINTELLIX) 20 MG TABS tablet Take 1 tablet (20 mg total) by mouth daily. 90 tablet 0   No current facility-administered medications for this visit.    Allergies as of 04/27/2021   (No Known Allergies)    ROS:  General: Negative for anorexia, unintentional weight loss, fever, chills, fatigue, weakness. ENT: Negative for hoarseness, difficulty swallowing , nasal congestion. CV: Negative for chest pain, angina, palpitations, dyspnea on exertion, peripheral edema.  Respiratory: Negative for dyspnea at rest, dyspnea on exertion, cough, sputum, wheezing.  GI: See history of present illness. GU:  Negative for dysuria, hematuria,  urinary incontinence, urinary frequency, nocturnal urination.  Endo: Negative for unusual weight change.    Physical Examination:   BP 115/76   Pulse 94   Temp (!) 97.5 F (36.4 C) (Temporal)   Ht 5\' 5"  (1.651 m)   Wt 184 lb 9.6 oz (83.7 kg)   BMI 30.72 kg/m   General: Well-nourished, well-developed in no acute distress. Accompanied by her social worker Eyes: No icterus. Mouth: masked Abdomen: Bowel sounds are normal, nondistended, no hepatosplenomegaly or masses, no abdominal bruits or hernia , no rebound or guarding.  Minimal  discomfort with palpation in the lower abdomen.  Extremities: No lower extremity edema. No clubbing or deformities. Neuro: Alert and oriented x 4   Skin: Warm and dry, no jaundice.   Psych: Alert and cooperative, normal mood and affect.  Labs:  Lab Results  Component Value Date   CREATININE 0.91 11/18/2020   BUN 4 (L) 11/18/2020   NA 139 11/18/2020   K 4.4 11/18/2020   CL 104 11/18/2020   CO2 23 11/18/2020   Lab Results  Component Value Date   ALT 24 11/18/2020   AST 43 (H) 11/18/2020   ALKPHOS 65 11/04/2014   BILITOT 0.4 11/18/2020   Lab Results  Component Value Date   WBC 10.9 (H) 10/21/2019   HGB 14.4 10/21/2019   HCT 45.1 10/21/2019   MCV 92.4 10/21/2019   PLT 412 (H) 10/21/2019     Imaging Studies: No results found.   Assessment:  GERD: History of ulcerative/erosive reflux esophagitis, gastritis on EGD last year.  PPI was switched to pantoprazole twice daily.  Overall doing much better.  She reports that she rarely takes the morning dose, typically taking pantoprazole only in the evenings.  No longer vomiting.  No heartburn.  She continues to have some vague generalized abdominal pain, really unable to provide significant details.  She is not sure of any aggravating or alleviating factors.  Overall she feels like she is much improved, Education officer, museum agrees and feels like some of her nonspecific abdominal pain may be related to her anxiety.   Constipation: Does not seem to be much of an issue at this time.  Rarely has a solid stool.  Uses senna as needed.  She is unable to quantify how many times she has a BM daily.  Denies nocturnal diarrhea.  No blood in stool.  We will continue to monitor for now.  If she is having frequent stools, worsening abdominal pain, blood in the stool, she will need further evaluation.  Plan: Continue pantoprazole, once daily as she seems to be doing well on that dose right now. To continue to watch her weight, goal of further weight loss.   She is doing well with portion sizes and eating healthier.  She will continue to work on that. Call with any change in symptoms such as worsening abdominal pain, blood in the stool, increased stooling. We will plan to see her back in 6 months for follow-up of GERD, abnormal LFTs/fatty liver, abdominal pain.

## 2021-05-05 ENCOUNTER — Ambulatory Visit (INDEPENDENT_AMBULATORY_CARE_PROVIDER_SITE_OTHER): Payer: Medicare Other | Admitting: Nurse Practitioner

## 2021-05-18 ENCOUNTER — Telehealth (INDEPENDENT_AMBULATORY_CARE_PROVIDER_SITE_OTHER): Payer: Medicare Other | Admitting: Neurology

## 2021-05-18 ENCOUNTER — Other Ambulatory Visit: Payer: Self-pay

## 2021-05-18 ENCOUNTER — Encounter: Payer: Self-pay | Admitting: Neurology

## 2021-05-18 VITALS — Ht 66.0 in | Wt 185.0 lb

## 2021-05-18 DIAGNOSIS — G43009 Migraine without aura, not intractable, without status migrainosus: Secondary | ICD-10-CM | POA: Diagnosis not present

## 2021-05-18 MED ORDER — GABAPENTIN 300 MG PO CAPS
ORAL_CAPSULE | ORAL | 3 refills | Status: DC
Start: 2021-05-18 — End: 2021-11-25

## 2021-05-18 MED ORDER — RIZATRIPTAN BENZOATE 10 MG PO TBDP: ORAL_TABLET | ORAL | 11 refills | Status: DC

## 2021-05-18 NOTE — Progress Notes (Signed)
Virtual Visit via Video Note The purpose of this virtual visit is to provide medical care while limiting exposure to the novel coronavirus.    Consent was obtained for video visit:  Yes.   Answered questions that patient had about telehealth interaction:  Yes.   I discussed the limitations, risks, security and privacy concerns of performing an evaluation and management service by telemedicine. I also discussed with the patient that there may be a patient responsible charge related to this service. The patient expressed understanding and agreed to proceed.  Pt location: Home Physician Location: office Name of referring provider:  Ailene Ards, NP I connected with Jennifer Higgins at patients initiation/request on 05/18/2021 at 10:00 AM EST by video enabled telemedicine application and verified that I am speaking with the correct person using two identifiers. Pt MRN:  [id number] Pt DOB:  03/27/1996 Video Participants:  Jennifer Higgins;  Jennifer Higgins (aunt)   History of Present Illness:  The patient had a virtual video visit on 05/18/2021. She was last seen in the neurology clinic 9 months ago for migraines without aura. She is again accompanied by her her aunt Jennifer Higgins who helps supplement the history today. Since her last visit, she reports the migraines have been pretty good. Her aunt reminds her that she has been having more, but she states that they are not as bad. Jennifer Higgins reports she tells her about headaches at least a couple times a week, a few weeks ago it lasted for 4 days where she stayed in her room with the lights off. No nausea/vomiting, dizziness, vision changes, focal numbness/tingling/weakness, no falls. She has not been taking any prn OTC medication because Tylenol does not help. Excedrin caused gastritis. She is on gabapentin 300mg  qhs for migraine prophylaxis without side effects. She is not getting enough sleep, she sleeps from 12am to 2am, stays up until 5am, then goes back to sleep  until 1pm. No naps.    History on Initial Assessment 11/27/2016: This is a 25 year old right-handed woman with a history of Autism Spectrum Disorder diagnosed at [facility] in 2016, migraines, presenting for evaluation of migraines and memory difficulties. Her social worker Jennifer Higgins is mainly interested in how they can figure out how to teach her to hopefully learn to be independent because it is felt that she has the potential to do this. Jennifer Higgins is also concerned about her awkward gait.   1. Migraines. She has difficulty describing her symptoms. Pain is sometimes on the temple or all over, it can be throbbing or pressure-like, sometimes lasting all day if she does not take Maxalt. This can be associated with nausea and vomiting. She does not ask for medication often. She lives with her great-aunt who just gives it to her. Migraines are triggered by heat and loud noises, she would vomit and be unable to function. She had a migraine at [place] one time, during their senior picnic, and during prom 2 years ago where she was frustrated she could not participate. She had previously been seeing [facility] and prescribed Topamax 100mg  BID, but it altered her taste with sodas, which is hard for her. Topamax was restarted recently by her PCP.   2. Memory. She is noted to have slowed responses during the visit, able to answer simple questions, needing rephrasing of questions by her Education officer, museum. She answers "I don't know a lot" when asked questions. They report that if she is very interested in the topic, she may remember, otherwise she  does not retain information. Jennifer Higgins reports her IQ is 62 but "there is so much she does not understand." She recalls an incident when she brought [facility] for Neurology follow-up, she had been to [facility] several times, but on the last visit in September, she was convinced she had never been there before. They have tried using charts to help her remember to bathe and take her medications. They tried to  give her control over her pillbox but this did not go well, now all her medications are put by her great-aunt in the pillbox and she would remember to take them. When Jennifer Higgins reminded her about her calendar, she said "I don't know what a calendar is." She loses things a lot and does not remember where they are. Jennifer Higgins reports her behavior is much better as she has gotten older, she is not near as defiant and able to get along better with family. She has been living with her great-aunt since age 80 or 73 when her mother passed away from [place] disease. When she turned 16, she "got tired of listening to her aunt," and was convinced by people she met on the school bus that they were her friends and spent all the money her mother's will bequeathed her. She was telling a lot of lies about her great-aunt, so this made her great-aunt file for her to have a different guardian, Jennifer Higgins. She was deemed incompetent, she cannot manage money or protect herself, she does not know safety risks and is easily manipulated.    She denies any dizziness, diplopia, dysarthria/dysphagia, neck/back pain, focal numbness/tingling/weakness, bowel/bladder dysfunction. No falls. She does not sleep well due to anxiety related to her autism, she thinks people are talking about her. Jennifer Higgins reports she has been tested twice for Wilson's disease and both were negative. She has had 2 brain MRIs, in 2011 and most recently in January 2017 which I personally reviewed, no acute changes seen, hippocampi symmetric with no abnormal signal or enhancement.   Neuropsych testing in 02/2017: Diagnosis of Autism Spectrum Disorder (by history), Major depressive episode. Testing confirmed there is no accompanying intellectual impairment, full scall IQ falls in the low average range. "Overall, her cognitive profile was reflective of mild frontal lobe dysfunction, consistent with what is often seen in autism. She performed within the expected (low average to  average) range on tests of auditory memory. There was no evidence of consolidation dysfunction but instead likely reduced cognitive strategies to improve encoding/retrieval (reflective of disruption to frontal-subcortical networks and indicative of executive dysfunction). She also demonstrated poor cognitive strategy on a test of phonemic verbal fluency. Her mental flexibility and set-shifting were severely impaired, a common finding in autism again reflecting frontal lobe involvement. She appears to be Level 2, requiring substantial support." It was noted that evaluation did not include formal testing for learning disorder and this could not be commented on.     Current Outpatient Medications on File Prior to Visit  Medication Sig Dispense Refill   amantadine (SYMMETREL) 100 MG capsule Take 1 capsule (100 mg total) by mouth 2 (two) times daily. 180 capsule 1   CAMRESE 0.15-0.03 &0.01 MG tablet TAKE 1 TABLET BY MOUTH DAILY 91 tablet 0   Cholecalciferol (VITAMIN D3 PO) Take 5,000 Units by mouth daily.      cloNIDine (CATAPRES) 0.1 MG tablet Take 1 tablet (0.1 mg total) by mouth at bedtime. 90 tablet 1   DENTA 5000 PLUS 1.1 % CREA dental cream  gabapentin (NEURONTIN) 300 MG capsule Take 1 capsule (300 mg total) by mouth at bedtime. 90 capsule 3   Omega-3 1000 MG CAPS Take by mouth daily.      pantoprazole (PROTONIX) 40 MG tablet TAKE 1 TABLET BY MOUTH 2 TIMES DAILY. (Patient taking differently: 40 mg daily.) 60 tablet 5   rosuvastatin (CRESTOR) 5 MG tablet Take 1 tablet (5 mg total) by mouth daily. 90 tablet 3   senna (SENOKOT) 8.6 MG tablet Take 1 tablet by mouth as needed.      vortioxetine HBr (TRINTELLIX) 20 MG TABS tablet Take 1 tablet (20 mg total) by mouth daily. 90 tablet 0   No current facility-administered medications on file prior to visit.     Observations/Objective:   Vitals:   05/18/21 0902  Weight: 185 lb (83.9 kg)  Height: 5\' 6"  (1.676 m)   GEN:  The patient appears  stated age and is in NAD.  Neurological examination: Patient is awake, alert. No aphasia or dysarthria. Intact fluency and comprehension. Cranial nerves: Extraocular movements intact with no nystagmus. No facial asymmetry. Motor: moves all extremities symmetrically, at least anti-gravity x 4.    Assessment and Plan:   This is a 25 yo RH woman with a history of autism spectrum disorder (ASD) with migraines without aura. She had a good response to gabapentin however today reports an increase in headaches. We discussed the imporatnce of good sleep hygiene. Increase Gabapentin 300mg : take 2 caps qhs, side effects discussed. OTC pain medications have not helped, she was previously on prn Maxalt, refills will be sent. Follow-up in 6-8 months, call for any changes.    Follow Up Instructions:   -I discussed the assessment and treatment plan with the patient. The patient was provided an opportunity to ask questions and all were answered. The patient agreed with the plan and demonstrated an understanding of the instructions.   The patient was advised to call back or seek an in-person evaluation if the symptoms worsen or if the condition fails to improve as anticipated.    Cameron Sprang, MD

## 2021-05-18 NOTE — Patient Instructions (Signed)
Increase Gabapentin 300mg : Take 2 capsules every night  2. Take the Maxalt as needed at onset of migraine. Do not take more than 3 times a week  3. Follow-up in 6 months, call for any changes

## 2021-08-01 ENCOUNTER — Other Ambulatory Visit: Payer: Self-pay | Admitting: Adult Health

## 2021-08-01 ENCOUNTER — Other Ambulatory Visit: Payer: Self-pay | Admitting: Family Medicine

## 2021-08-01 DIAGNOSIS — F39 Unspecified mood [affective] disorder: Secondary | ICD-10-CM

## 2021-08-26 ENCOUNTER — Ambulatory Visit: Payer: Medicare Other | Admitting: Cardiology

## 2021-10-12 ENCOUNTER — Ambulatory Visit: Payer: Medicare Other | Admitting: Family Medicine

## 2021-10-25 ENCOUNTER — Ambulatory Visit: Payer: Medicare Other | Admitting: Gastroenterology

## 2021-10-26 ENCOUNTER — Encounter: Payer: Self-pay | Admitting: Internal Medicine

## 2021-11-01 ENCOUNTER — Other Ambulatory Visit: Payer: Self-pay | Admitting: Adult Health

## 2021-11-03 ENCOUNTER — Ambulatory Visit: Payer: Medicare Other | Admitting: Family Medicine

## 2021-11-25 ENCOUNTER — Encounter: Payer: Self-pay | Admitting: Neurology

## 2021-11-25 ENCOUNTER — Ambulatory Visit (INDEPENDENT_AMBULATORY_CARE_PROVIDER_SITE_OTHER): Payer: Medicare Other | Admitting: Neurology

## 2021-11-25 VITALS — BP 108/74 | HR 78 | Ht 65.0 in | Wt 173.0 lb

## 2021-11-25 DIAGNOSIS — G43009 Migraine without aura, not intractable, without status migrainosus: Secondary | ICD-10-CM

## 2021-11-25 MED ORDER — RIZATRIPTAN BENZOATE 10 MG PO TBDP: ORAL_TABLET | ORAL | 11 refills | Status: DC

## 2021-11-25 MED ORDER — GABAPENTIN 300 MG PO CAPS: ORAL_CAPSULE | ORAL | 3 refills | Status: DC
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
## 2021-11-25 NOTE — Patient Instructions (Signed)
Good to see you. Refills sent for Gabapentin 300mg : take 2 capsules every night. Refills also sent for Maxalt to take as needed for migraine. Follow-up in 8 months, call for any changes.

## 2021-12-07 ENCOUNTER — Other Ambulatory Visit: Payer: Self-pay | Admitting: Family Medicine

## 2021-12-07 DIAGNOSIS — F39 Unspecified mood [affective] disorder: Secondary | ICD-10-CM

## 2021-12-09 ENCOUNTER — Ambulatory Visit
Admission: RE | Admit: 2021-12-09 | Discharge: 2021-12-09 | Disposition: A | Discharge status: Home / Self Care | Payer: Medicare Other | Source: Ambulatory Visit | Attending: Nurse Practitioner | Admitting: Nurse Practitioner

## 2021-12-09 VITALS — BP 115/81 | HR 104 | Temp 98.3°F | Resp 16

## 2021-12-09 DIAGNOSIS — Z77120 Contact with and (suspected) exposure to mold (toxic): Secondary | ICD-10-CM | POA: Diagnosis not present

## 2021-12-09 DIAGNOSIS — J069 Acute upper respiratory infection, unspecified: Secondary | ICD-10-CM | POA: Diagnosis not present

## 2021-12-09 LAB — POCT RAPID STREP A (OFFICE): Rapid Strep A Screen: NEGATIVE

## 2021-12-09 MED ORDER — BENZONATATE 100 MG PO CAPS: 100.0000 mg | ORAL_CAPSULE | Freq: Three times a day (TID) | ORAL | 0 refills | Status: DC | PRN

## 2021-12-09 MED ORDER — PROMETHAZINE-DM 6.25-15 MG/5ML PO SYRP: 5.0000 mL | ORAL_SOLUTION | Freq: Four times a day (QID) | ORAL | 0 refills | Status: DC | PRN

## 2021-12-09 NOTE — ED Triage Notes (Signed)
Pt reports congestion, cough, sore throat x 4-5 days. Reports a water pipe is broken and theres mold.

## 2021-12-09 NOTE — Discharge Instructions (Addendum)
Your symptoms and exam findings are most consistent with a viral upper respiratory infection. These usually run their course in about 10 days.  If your symptoms last longer than 10 days without improvement, please follow up with your primary care provider.  If your symptoms, worsen, please go to the Emergency Room.     Some things that can make you feel better are: - Increased rest - Increasing fluid with water/sugar free electrolytes - Acetaminophen and ibuprofen as needed for fever/pain.  - Salt water gargling, chloraseptic spray and throat lozenges - OTC guaifenesin (Mucinex).  - Saline sinus flushes or a neti pot.  - Humidifying the air. - Cough syrup/pills as needed at night time for cough.
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
## 2021-12-30 ENCOUNTER — Encounter: Payer: Self-pay | Admitting: Family Medicine

## 2021-12-30 ENCOUNTER — Ambulatory Visit (INDEPENDENT_AMBULATORY_CARE_PROVIDER_SITE_OTHER): Payer: Medicare Other | Admitting: Family Medicine

## 2021-12-30 VITALS — BP 111/75 | HR 91 | Temp 97.8°F | Ht 65.0 in | Wt 171.1 lb

## 2021-12-30 DIAGNOSIS — Z8659 Personal history of other mental and behavioral disorders: Secondary | ICD-10-CM

## 2021-12-30 DIAGNOSIS — E782 Mixed hyperlipidemia: Secondary | ICD-10-CM

## 2021-12-30 DIAGNOSIS — F39 Unspecified mood [affective] disorder: Secondary | ICD-10-CM | POA: Diagnosis not present

## 2021-12-30 DIAGNOSIS — F840 Autistic disorder: Secondary | ICD-10-CM

## 2021-12-30 DIAGNOSIS — H6123 Impacted cerumen, bilateral: Secondary | ICD-10-CM

## 2021-12-30 DIAGNOSIS — K2100 Gastro-esophageal reflux disease with esophagitis, without bleeding: Secondary | ICD-10-CM | POA: Diagnosis not present

## 2021-12-30 NOTE — Patient Instructions (Addendum)
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
## 2022-01-04 ENCOUNTER — Ambulatory Visit (INDEPENDENT_AMBULATORY_CARE_PROVIDER_SITE_OTHER): Payer: Medicare Other

## 2022-01-04 VITALS — Wt 171.0 lb

## 2022-01-04 DIAGNOSIS — Z0000 Encounter for general adult medical examination without abnormal findings: Secondary | ICD-10-CM

## 2022-01-04 NOTE — Patient Instructions (Signed)
Jennifer Higgins, Thank you for taking time to come for your Medicare Wellness Visit. I appreciate your ongoing commitment to your health goals. Please review the following plan we discussed and let me know if I can assist you in the future.   Screening recommendations/referrals: Colonoscopy: due at age 25 Mammogram: due at age 75 Bone Density: due at age 24 Recommended yearly ophthalmology/optometry visit for glaucoma screening and checkup Recommended yearly dental visit for hygiene and checkup  Vaccinations: Influenza vaccine: Done 02/14/2021 - Repeat annually  Pneumococcal vaccine: due at age 20 Tdap vaccine: Done 07/31/2019 - Repeat in 10 years  Shingles vaccine: due at age 76  Covid-19: declined  Advanced directives: in chart  Conditions/risks identified: Aim for 30 minutes of exercise or brisk walking, 6-8 glasses of water, and 5 servings of fruits and vegetables each day.   Next appointment: Follow up in one year for your annual wellness visit.   Preventive Care 3-66 Years Old, Female Preventive care refers to lifestyle choices and visits with your health care provider that can promote health and wellness. Preventive care visits are also called wellness exams. What can I expect for my preventive care visit? Counseling During your preventive care visit, your health care provider may ask about your: Medical history, including: Past medical problems. Family medical history. Pregnancy history. Current health, including: Menstrual cycle. Method of birth control. Emotional well-being. Home life and relationship well-being. Sexual activity and sexual health. Lifestyle, including: Alcohol, nicotine or tobacco, and drug use. Access to firearms. Diet, exercise, and sleep habits. Work and work Statistician. Sunscreen use. Safety issues such as seatbelt and bike helmet use. Physical exam Your health care provider may check your: Height and weight. These may be used to calculate your  BMI (body mass index). BMI is a measurement that tells if you are at a healthy weight. Waist circumference. This measures the distance around your waistline. This measurement also tells if you are at a healthy weight and may help predict your risk of certain diseases, such as type 2 diabetes and high blood pressure. Heart rate and blood pressure. Body temperature. Skin for abnormal spots. What immunizations do I need? Vaccines are usually given at various ages, according to a schedule. Your health care provider will recommend vaccines for you based on your age, medical history, and lifestyle or other factors, such as travel or where you work. What tests do I need? Screening Your health care provider may recommend screening tests for certain conditions. This may include: Pelvic exam and Pap test. Lipid and cholesterol levels. Diabetes screening. This is done by checking your blood sugar (glucose) after you have not eaten for a while (fasting). Hepatitis B test. Hepatitis C test. HIV (human immunodeficiency virus) test. STI (sexually transmitted infection) testing, if you are at risk. BRCA-related cancer screening. This may be done if you have a family history of breast, ovarian, tubal, or peritoneal cancers. Talk with your health care provider about your test results, treatment options, and if necessary, the need for more tests. Follow these instructions at home: Eating and drinking  Eat a healthy diet that includes fresh fruits and vegetables, whole grains, lean protein, and low-fat dairy products. Take vitamin and mineral supplements as recommended by your health care provider. Do not drink alcohol if: Your health care provider tells you not to drink. You are pregnant, may be pregnant, or are planning to become pregnant. If you drink alcohol: Limit how much you have to 0-1 drink a day. Know how  much alcohol is in your drink. In the [place] one drink equals one 12 oz bottle of beer (355  mL), one 5 oz glass of wine (148 mL), or one 1 oz glass of hard liquor (44 mL). Lifestyle Brush your teeth every morning and night with fluoride toothpaste. Floss one time each day. Exercise for at least 30 minutes 5 or more days each week. Do not use any products that contain nicotine or tobacco. These products include cigarettes, chewing tobacco, and vaping devices, such as e-cigarettes. If you need help quitting, ask your health care provider. Do not use drugs. If you are sexually active, practice safe sex. Use a condom or other form of protection to prevent STIs. If you do not wish to become pregnant, use a form of birth control. If you plan to become pregnant, see your health care provider for a prepregnancy visit. Find healthy ways to manage stress, such as: Meditation, yoga, or listening to music. Journaling. Talking to a trusted person. Spending time with friends and family. Minimize exposure to UV radiation to reduce your risk of skin cancer. Safety Always wear your seat belt while driving or riding in a vehicle. Do not drive: If you have been drinking alcohol. Do not ride with someone who has been drinking. If you have been using any mind-altering substances or drugs. While texting. When you are tired or distracted. Wear a helmet and other protective equipment during sports activities. If you have firearms in your house, make sure you follow all gun safety procedures. Seek help if you have been physically or sexually abused. What's next? Go to your health care provider once a year for an annual wellness visit. Ask your health care provider how often you should have your eyes and teeth checked. Stay up to date on all vaccines. This information is not intended to replace advice given to you by your health care provider. Make sure you discuss any questions you have with your health care provider. Document Revised: 11/10/2020 Document Reviewed: 11/10/2020 Elsevier Patient  Education  [place].
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
## 2022-01-05 ENCOUNTER — Encounter: Payer: Self-pay | Admitting: Family Medicine

## 2022-01-05 ENCOUNTER — Ambulatory Visit (INDEPENDENT_AMBULATORY_CARE_PROVIDER_SITE_OTHER): Payer: Medicare Other | Admitting: Family Medicine

## 2022-01-05 VITALS — BP 114/76 | HR 98 | Temp 98.1°F | Ht 65.0 in | Wt 169.2 lb

## 2022-01-05 DIAGNOSIS — E782 Mixed hyperlipidemia: Secondary | ICD-10-CM

## 2022-01-05 DIAGNOSIS — H6123 Impacted cerumen, bilateral: Secondary | ICD-10-CM | POA: Diagnosis not present

## 2022-01-05 DIAGNOSIS — E663 Overweight: Secondary | ICD-10-CM | POA: Diagnosis not present

## 2022-01-05 NOTE — Progress Notes (Signed)
   Acute Office Visit  Subjective:     Patient ID: Jennifer Higgins, female    DOB: [birth year], 26 y.o.   MRN: [id number]  Chief Complaint  Patient presents with   Cerumen Impaction    HPI Here with guardian today. Patient is in today for bilateral ear wax impaction. She does report muffled hearing. She was seen last week but needed time to prepare to have her ears irrigated. She does not tolerate Debrox. She is also ready to have her labs checked today. She is fasting.   ROS As per HPI.      Objective:    BP 114/76   Pulse 98   Temp 98.1 F (36.7 C) (Temporal)   Ht _0  (1.651 m)   Wt 169 lb 4 oz (76.8 kg)   SpO2 99%   BMI 28.16 kg/m    Physical Exam Vitals and nursing note reviewed.  Constitutional:      General: She is not in acute distress.    Appearance: She is not ill-appearing, toxic-appearing or diaphoretic.  HENT:     Right Ear: Ear canal and external ear normal. There is impacted cerumen.     Left Ear: Ear canal and external ear normal. There is impacted cerumen.  Pulmonary:     Effort: Pulmonary effort is normal. No respiratory distress.  Musculoskeletal:     Right lower leg: No edema.     Left lower leg: No edema.  Skin:    General: Skin is warm and dry.  Neurological:     Mental Status: She is alert and oriented to person, place, and time.  Psychiatric:        Mood and Affect: Mood normal.        Behavior: Behavior normal.   Ear Cerumen Removal  Date/Time: 01/05/2022 1:59 PM  Performed by: Gwenlyn Perking, FNP Authorized by: Gwenlyn Perking, FNP   Anesthesia: Local Anesthetic: none Location details: right ear and left ear Patient tolerance: patient tolerated the procedure well with no immediate complications Comments: Normal TMs visualized bilaterally.  Procedure type: irrigation  Sedation: Patient sedated: no     No results found for any visits on 01/05/22.      Assessment & Plan:   [name] was seen today for cerumen  impaction.  Diagnoses and all orders for this visit:  Bilateral impacted cerumen Irrigation today in office. Normal TMs visualized following. Reports improvement in hearing.  -     Ear Cerumen Removal  Mixed hyperlipidemia On statin. Fasting labs pending.  -     CBC with Differential/Platelet -     CMP14+EGFR -     Lipid panel  Follow up in 6 months, sooner if needed.   The patient indicates understanding of these issues and agrees with the plan.   Gwenlyn Perking, FNP

## 2022-01-06 ENCOUNTER — Other Ambulatory Visit: Payer: Self-pay | Admitting: Family Medicine

## 2022-01-06 DIAGNOSIS — E782 Mixed hyperlipidemia: Secondary | ICD-10-CM

## 2022-01-06 DIAGNOSIS — F39 Unspecified mood [affective] disorder: Secondary | ICD-10-CM

## 2022-01-06 LAB — LIPID PANEL
Chol/HDL Ratio: 6.5 ratio — ABNORMAL HIGH (ref 0.0–4.4)
Cholesterol, Total: 279 mg/dL — ABNORMAL HIGH (ref 100–199)
HDL: 43 mg/dL (ref >39)
LDL Chol Calc (NIH): 206 mg/dL — ABNORMAL HIGH (ref 0–99)
Triglycerides: 159 mg/dL — ABNORMAL HIGH (ref 0–149)
VLDL Cholesterol Cal: 30 mg/dL (ref 5–40)

## 2022-01-06 LAB — CBC WITH DIFFERENTIAL/PLATELET
Basophils Absolute: 0 10*3/uL (ref 0.0–0.2)
Basos: 0 %
EOS (ABSOLUTE): 0.2 10*3/uL (ref 0.0–0.4)
Eos: 3 %
Hematocrit: 44.1 % (ref 34.0–46.6)
Hemoglobin: 14 g/dL (ref 11.1–15.9)
Immature Grans (Abs): 0 10*3/uL (ref 0.0–0.1)
Immature Granulocytes: 0 %
Lymphocytes Absolute: 2.7 10*3/uL (ref 0.7–3.1)
Lymphs: 39 %
MCH: 27.4 pg (ref 26.6–33.0)
MCHC: 31.7 g/dL (ref 31.5–35.7)
MCV: 86 fL (ref 79–97)
Monocytes Absolute: 0.4 10*3/uL (ref 0.1–0.9)
Monocytes: 6 %
Neutrophils Absolute: 3.5 10*3/uL (ref 1.4–7.0)
Neutrophils: 52 %
Platelets: 370 10*3/uL (ref 150–450)
RBC: 5.11 x10E6/uL (ref 3.77–5.28)
RDW: 13.2 % (ref 11.7–15.4)
WBC: 6.9 10*3/uL (ref 3.4–10.8)

## 2022-01-06 LAB — CMP14+EGFR
ALT: 6 IU/L (ref 0–32)
AST: 14 IU/L (ref 0–40)
Albumin/Globulin Ratio: 1.7 (ref 1.2–2.2)
Albumin: 4.5 g/dL (ref 4.0–5.0)
Alkaline Phosphatase: 73 IU/L (ref 44–121)
BUN/Creatinine Ratio: 6 — ABNORMAL LOW (ref 9–23)
BUN: 5 mg/dL — ABNORMAL LOW (ref 6–20)
Bilirubin Total: 0.3 mg/dL (ref 0.0–1.2)
CO2: 22 mmol/L (ref 20–29)
Calcium: 9.3 mg/dL (ref 8.7–10.2)
Chloride: 102 mmol/L (ref 96–106)
Creatinine, Ser: 0.85 mg/dL (ref 0.57–1.00)
Globulin, Total: 2.6 g/dL (ref 1.5–4.5)
Glucose: 78 mg/dL (ref 70–99)
Potassium: 4.7 mmol/L (ref 3.5–5.2)
Sodium: 139 mmol/L (ref 134–144)
Total Protein: 7.1 g/dL (ref 6.0–8.5)
eGFR: 98 mL/min/{1.73_m2} (ref >59)

## 2022-01-06 MED ORDER — ROSUVASTATIN CALCIUM 10 MG PO TABS: 10.0000 mg | ORAL_TABLET | Freq: Every day | ORAL | 3 refills | Status: DC

## 2022-01-31 ENCOUNTER — Telehealth (HOSPITAL_COMMUNITY): Payer: Medicare Other | Admitting: Psychiatry

## 2022-02-06 ENCOUNTER — Encounter (HOSPITAL_COMMUNITY): Payer: Self-pay | Admitting: Psychiatry

## 2022-02-06 ENCOUNTER — Telehealth (INDEPENDENT_AMBULATORY_CARE_PROVIDER_SITE_OTHER): Payer: Medicare Other | Admitting: Psychiatry

## 2022-02-06 DIAGNOSIS — F4310 Post-traumatic stress disorder, unspecified: Secondary | ICD-10-CM

## 2022-02-06 DIAGNOSIS — F840 Autistic disorder: Secondary | ICD-10-CM

## 2022-02-06 DIAGNOSIS — F39 Unspecified mood [affective] disorder: Secondary | ICD-10-CM

## 2022-02-06 DIAGNOSIS — F4010 Social phobia, unspecified: Secondary | ICD-10-CM

## 2022-02-06 DIAGNOSIS — H9325 Central auditory processing disorder: Secondary | ICD-10-CM

## 2022-02-06 MED ORDER — AMANTADINE HCL 100 MG PO CAPS: 100.0000 mg | ORAL_CAPSULE | Freq: Two times a day (BID) | ORAL | 1 refills | Status: DC

## 2022-02-06 MED ORDER — CLONIDINE HCL 0.1 MG PO TABS: 0.1000 mg | ORAL_TABLET | Freq: Every day | ORAL | 1 refills | Status: DC

## 2022-02-06 MED ORDER — VORTIOXETINE HBR 20 MG PO TABS: 20.0000 mg | ORAL_TABLET | Freq: Every day | ORAL | 0 refills | Status: DC
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
## 2022-02-07 ENCOUNTER — Other Ambulatory Visit: Payer: Self-pay | Admitting: Adult Health

## 2022-03-13 ENCOUNTER — Telehealth (INDEPENDENT_AMBULATORY_CARE_PROVIDER_SITE_OTHER): Payer: Medicare Other | Admitting: Psychiatry

## 2022-03-13 DIAGNOSIS — F4323 Adjustment disorder with mixed anxiety and depressed mood: Secondary | ICD-10-CM | POA: Diagnosis not present

## 2022-03-13 DIAGNOSIS — F4310 Post-traumatic stress disorder, unspecified: Secondary | ICD-10-CM | POA: Diagnosis not present

## 2022-03-13 DIAGNOSIS — F4010 Social phobia, unspecified: Secondary | ICD-10-CM

## 2022-03-13 DIAGNOSIS — F331 Major depressive disorder, recurrent, moderate: Secondary | ICD-10-CM | POA: Insufficient documentation

## 2022-03-13 DIAGNOSIS — F840 Autistic disorder: Secondary | ICD-10-CM

## 2022-03-13 DIAGNOSIS — F332 Major depressive disorder, recurrent severe without psychotic features: Secondary | ICD-10-CM | POA: Insufficient documentation
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
## 2022-03-17 ENCOUNTER — Encounter: Payer: Self-pay | Admitting: *Deleted

## 2022-04-13 ENCOUNTER — Telehealth (HOSPITAL_COMMUNITY): Payer: Medicare Other | Admitting: Psychiatry

## 2022-04-13 ENCOUNTER — Ambulatory Visit (INDEPENDENT_AMBULATORY_CARE_PROVIDER_SITE_OTHER): Payer: Medicare Other | Admitting: Clinical

## 2022-04-13 ENCOUNTER — Encounter (HOSPITAL_COMMUNITY): Payer: Self-pay

## 2022-04-13 DIAGNOSIS — F4010 Social phobia, unspecified: Secondary | ICD-10-CM

## 2022-04-13 DIAGNOSIS — F4310 Post-traumatic stress disorder, unspecified: Secondary | ICD-10-CM | POA: Diagnosis not present

## 2022-04-13 DIAGNOSIS — F4323 Adjustment disorder with mixed anxiety and depressed mood: Secondary | ICD-10-CM | POA: Diagnosis not present
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
## 2022-04-13 NOTE — Plan of Care (Signed)
Verbal Consent 

## 2022-04-14 ENCOUNTER — Telehealth (INDEPENDENT_AMBULATORY_CARE_PROVIDER_SITE_OTHER): Payer: Medicare Other | Admitting: Psychiatry

## 2022-04-14 DIAGNOSIS — F4010 Social phobia, unspecified: Secondary | ICD-10-CM

## 2022-04-14 DIAGNOSIS — F4310 Post-traumatic stress disorder, unspecified: Secondary | ICD-10-CM | POA: Diagnosis not present

## 2022-04-14 DIAGNOSIS — F39 Unspecified mood [affective] disorder: Secondary | ICD-10-CM

## 2022-04-14 DIAGNOSIS — F4323 Adjustment disorder with mixed anxiety and depressed mood: Secondary | ICD-10-CM

## 2022-04-14 DIAGNOSIS — F840 Autistic disorder: Secondary | ICD-10-CM | POA: Diagnosis not present

## 2022-04-14 MED ORDER — VORTIOXETINE HBR 20 MG PO TABS: 20.0000 mg | ORAL_TABLET | Freq: Every day | ORAL | 0 refills | Status: DC
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
## 2022-04-14 NOTE — Patient Instructions (Signed)
We didn't make any medication changes today. Continue to practice the mindfulness techniques, they appear to be helpful!

## 2022-04-28 ENCOUNTER — Encounter: Payer: Self-pay | Admitting: *Deleted

## 2022-05-03 ENCOUNTER — Other Ambulatory Visit: Payer: Self-pay | Admitting: Gastroenterology

## 2022-05-17 ENCOUNTER — Ambulatory Visit (INDEPENDENT_AMBULATORY_CARE_PROVIDER_SITE_OTHER): Payer: Medicare Other | Admitting: Clinical

## 2022-05-17 DIAGNOSIS — F4310 Post-traumatic stress disorder, unspecified: Secondary | ICD-10-CM

## 2022-05-17 DIAGNOSIS — F4010 Social phobia, unspecified: Secondary | ICD-10-CM | POA: Diagnosis not present

## 2022-05-17 DIAGNOSIS — F4323 Adjustment disorder with mixed anxiety and depressed mood: Secondary | ICD-10-CM

## 2022-05-17 NOTE — Progress Notes (Signed)
IN PERSON  I connected with Jennifer Higgins on 05/17/22 at  1:00 PM EST in person and verified that I am speaking with the correct person using two identifiers.  Location: Patient: Office Provider: Office    I discussed the limitations of evaluation and management by telemedicine and the availability of in person appointments. The patient expressed understanding and agreed to proceed. ( IN PERSON)   THERAPY PROGRESS NOTE   Session Time: 1:00 PM- 1:45 PM   Participation Level: Active   Behavioral Response: CasualAlert/Anxious   Type of Therapy: Individual Therapy   Treatment Goals addressed: Coping for Adjustment / Anxiety / Autism   Interventions: CBT, DBT, Solution Focused, Strength-based and Supportive   Summary: Jennifer Higgins is a 25y.o. female who presents with Adjustment Disorder with mixed  mood and Anxiety, [name] social anxiety (Autism),. The OPT therapist worked with the patient for her OPT treatment session. The OPT therapist utilized Motivational Interviewing to assist in creating therapeutic repore. The patient in the session was engaged and work in collaboration giving feedback about her triggers and symptoms over the past few weeks. The patient spoke about changes since her last session including the Death of her Aunt who was a caretaker for the patient.. The patient spoke about her work to better control her anxiousness and nausea. The patient spoke about working to be more active , independent, and social. The patient spoke about her preparation for the upcoming Christmas holiday and spending time potentially with family. The OPT therapist worked with the patient in session to identify potential short term goals and the patient indicated work on [employer] and socialization as well as independence are goals.. The patient spoke about ongoing work with her med management. The OPT therapist overviewed upcoming appointments as listed in the patients MyChart.   Suicidal/Homicidal:  Nowithout intent/plan   Therapist Response:The OPT therapist worked with the patient for the patients scheduled session. The patient was engaged in her session and gave feedback in relation to triggers, symptoms, and behavior responses over the past few weeks. The patient identified large change connected to the passing of a caretaker/family member. The OPT therapist worked with the patient utilizing an in session Cognitive Behavioral Therapy exercise. The patient was responsive in the session and verbalized, " I have been doing ok I think ". With the change comes challenges for the patient who struggles with Autism to become more independent including managing her money and budgeting. The OPT therapist worked with the patient overviewing basic health areas including sleep cycle, eating habits, hygiene, and physical exercise. The patient in this session verbalized no current S/I or H/I.  The patient spoke about considering making changes including after the holidays going with family to attend a local church. The patient spoke about making  choices with her diet. The patient spoke about taking an approach of being more adult-like and proactive with things that need to be taken care of. The patient has continued to care for her pet.The OPT therapist will continue treatment work with the patient in her next scheduled session.   Plan: Return again in 2/3 weeks.   Diagnosis:      Axis I:Depression Social Anxiety, [name] Adjustment Disorder with mixed mood and anxiety                           Axis II: No diagnosis       Collaboration of Care: Overview of medication management program involvement  with psychiatrist Dr. Adrian Blackwater   Patient/Guardian was advised Release of Information must be obtained prior to any record release in order to collaborate their care with an outside provider. Patient/Guardian was advised if they have not already done so to contact the registration department to sign all necessary forms  in order for [place] to release information regarding their care.    Consent: Patient/Guardian gives verbal consent for treatment and assignment of benefits for services provided during this visit. Patient/Guardian expressed understanding and agreed to proceed.    I discussed the assessment and treatment plan with the patient. The patient was provided an opportunity to ask questions and all were answered. The patient agreed with the plan and demonstrated an understanding of the instructions.   The patient was advised to call back or seek an in-person evaluation if the symptoms worsen or if the condition fails to improve as anticipated.   I provided 45 minutes of face-to-face time during this encounter.   Suzan Garibaldi, LCSW   05/17/2022

## 2022-05-26 ENCOUNTER — Telehealth (INDEPENDENT_AMBULATORY_CARE_PROVIDER_SITE_OTHER): Payer: Medicare Other | Admitting: Psychiatry

## 2022-05-26 ENCOUNTER — Encounter (HOSPITAL_COMMUNITY): Payer: Self-pay | Admitting: Psychiatry

## 2022-05-26 DIAGNOSIS — F840 Autistic disorder: Secondary | ICD-10-CM

## 2022-05-26 DIAGNOSIS — F4010 Social phobia, unspecified: Secondary | ICD-10-CM

## 2022-05-26 DIAGNOSIS — F4310 Post-traumatic stress disorder, unspecified: Secondary | ICD-10-CM | POA: Diagnosis not present

## 2022-05-26 DIAGNOSIS — F4323 Adjustment disorder with mixed anxiety and depressed mood: Secondary | ICD-10-CM

## 2022-05-26 DIAGNOSIS — H9325 Central auditory processing disorder: Secondary | ICD-10-CM

## 2022-05-26 DIAGNOSIS — F39 Unspecified mood [affective] disorder: Secondary | ICD-10-CM

## 2022-05-26 NOTE — Patient Instructions (Signed)
I am sorry for your loss with your aunt's passing.  Sometimes grief can be delayed so if you do find herself feeling more sad as time passes it is okay but please let us know so that we can help.  We did not make any medication changes today.  Keep up the good work in psychotherapy.
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
## 2022-06-21 ENCOUNTER — Ambulatory Visit (INDEPENDENT_AMBULATORY_CARE_PROVIDER_SITE_OTHER): Payer: Medicare Other | Admitting: Clinical

## 2022-06-21 DIAGNOSIS — F4010 Social phobia, unspecified: Secondary | ICD-10-CM

## 2022-06-21 DIAGNOSIS — F4310 Post-traumatic stress disorder, unspecified: Secondary | ICD-10-CM

## 2022-06-21 DIAGNOSIS — F4323 Adjustment disorder with mixed anxiety and depressed mood: Secondary | ICD-10-CM | POA: Diagnosis not present

## 2022-06-21 NOTE — Progress Notes (Signed)
IN PERSON   I connected with Jennifer Higgins on 06/21/22 at  1:00 PM EST in person and verified that I am speaking with the correct person using two identifiers.   Location: Patient: Office Provider: Office    I discussed the limitations of evaluation and management by telemedicine and the availability of in person appointments. The patient expressed understanding and agreed to proceed. ( IN PERSON)     THERAPY PROGRESS NOTE   Session Time: 1:00 PM- 1:45 PM   Participation Level: Active   Behavioral Response: CasualAlert/Anxious   Type of Therapy: Individual Therapy   Treatment Goals addressed: Coping for Adjustment / Anxiety / Autism   Interventions: CBT, DBT, Solution Focused, Strength-based and Supportive   Summary: Jennifer Higgins is a 27 y.o. female who presents with Adjustment Disorder with mixed  mood and Anxiety, [name] social anxiety (Autism),. The OPT therapist worked with the patient for her OPT treatment session. The OPT therapist utilized Motivational Interviewing to assist in creating therapeutic repore. The patient in the session was engaged and work in collaboration giving feedback about her triggers and symptoms over the past few weeks including conflict within the household between the patients cousins who also live in the home. The patient spoke about potentially having to move if the in home conflict is not resolved. The patient spoke about working to be more active , independent, and social. The patient spoke about her recent social outings with friends to the theater. The patient continued reviewing her short term goals and the patient indicated work on [employer] and socialization as well as independence. The patient spoke about ongoing work with her med management including upcoming appointment next week with Dr. Nehemiah Settle. The OPT therapist overviewed upcoming appointments as listed in the patients MyChart.   Suicidal/Homicidal: Nowithout intent/plan   Therapist  Response:The OPT therapist worked with the patient for the patients scheduled session. The patient was engaged in her session and gave feedback in relation to triggers, symptoms, and behavior responses over the past few weeks. The patient identified stressor of living in a home post her aunt passing where there is conflict with the other family members (cousins) who are currently in conflict. The OPT therapist worked with the patient utilizing an in session Cognitive Behavioral Therapy exercise. The patient was responsive in the session and verbalized, " I have been doing good I think I went to the theater with some friends ".  The patient continues to work on being independent including managing her money and budgeting. The OPT therapist worked with the patient overviewing basic health areas including sleep cycle, eating habits, hygiene, and physical exercise. The patient in this session verbalized no current S/I or H/I.  The patient spoke about ongoing work to stay consistent with her hygiene.The OPT therapist will continue treatment work with the patient in her next scheduled session.   Plan: Return again in 2/3 weeks.   Diagnosis:      Axis I:Social Anxiety, [name] Adjustment Disorder with mixed mood and anxiety                           Axis II: No diagnosis       Collaboration of Care: Overview of medication management program involvement with psychiatrist Dr. Nehemiah Settle   Patient/Guardian was advised Release of Information must be obtained prior to any record release in order to collaborate their care with an outside provider. Patient/Guardian was advised if they have not  already done so to contact the registration department to sign all necessary forms in order for [place] to release information regarding their care.    Consent: Patient/Guardian gives verbal consent for treatment and assignment of benefits for services provided during this visit. Patient/Guardian expressed understanding and agreed to  proceed.    I discussed the assessment and treatment plan with the patient. The patient was provided an opportunity to ask questions and all were answered. The patient agreed with the plan and demonstrated an understanding of the instructions.   The patient was advised to call back or seek an in-person evaluation if the symptoms worsen or if the condition fails to improve as anticipated.   I provided 45 minutes of face-to-face time during this encounter.   Maye Hides, LCSW   06/21/2022

## 2022-06-27 ENCOUNTER — Encounter (HOSPITAL_COMMUNITY): Payer: Self-pay | Admitting: Psychiatry

## 2022-06-27 ENCOUNTER — Telehealth (INDEPENDENT_AMBULATORY_CARE_PROVIDER_SITE_OTHER): Payer: Medicare Other | Admitting: Psychiatry

## 2022-06-27 ENCOUNTER — Other Ambulatory Visit: Payer: Self-pay | Admitting: Adult Health

## 2022-06-27 DIAGNOSIS — F4310 Post-traumatic stress disorder, unspecified: Secondary | ICD-10-CM

## 2022-06-27 DIAGNOSIS — F39 Unspecified mood [affective] disorder: Secondary | ICD-10-CM

## 2022-06-27 DIAGNOSIS — F4010 Social phobia, unspecified: Secondary | ICD-10-CM

## 2022-06-27 DIAGNOSIS — F4323 Adjustment disorder with mixed anxiety and depressed mood: Secondary | ICD-10-CM

## 2022-06-27 DIAGNOSIS — F840 Autistic disorder: Secondary | ICD-10-CM | POA: Diagnosis not present

## 2022-06-27 MED ORDER — VORTIOXETINE HBR 20 MG PO TABS: 20.0000 mg | ORAL_TABLET | Freq: Every day | ORAL | 0 refills | Status: DC

## 2022-06-27 NOTE — Patient Instructions (Signed)
We didn't make any medication changes today. Keep up the good work with staying on top of your medication and going to therapy.
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
## 2022-06-30 ENCOUNTER — Ambulatory Visit (INDEPENDENT_AMBULATORY_CARE_PROVIDER_SITE_OTHER): Payer: Medicare Other | Admitting: Adult Health

## 2022-06-30 ENCOUNTER — Other Ambulatory Visit (HOSPITAL_COMMUNITY)
Admission: RE | Admit: 2022-06-30 | Discharge: 2022-06-30 | Disposition: A | Discharge status: Home / Self Care | Payer: Medicare Other | Source: Ambulatory Visit | Attending: Adult Health | Admitting: Adult Health

## 2022-06-30 ENCOUNTER — Encounter: Payer: Self-pay | Admitting: Adult Health

## 2022-06-30 VITALS — BP 114/81 | HR 91 | Ht 65.0 in | Wt 166.0 lb

## 2022-06-30 DIAGNOSIS — Z01419 Encounter for gynecological examination (general) (routine) without abnormal findings: Secondary | ICD-10-CM | POA: Insufficient documentation

## 2022-06-30 DIAGNOSIS — Z0000 Encounter for general adult medical examination without abnormal findings: Secondary | ICD-10-CM | POA: Insufficient documentation

## 2022-06-30 DIAGNOSIS — Z1151 Encounter for screening for human papillomavirus (HPV): Secondary | ICD-10-CM | POA: Diagnosis not present

## 2022-06-30 DIAGNOSIS — F840 Autistic disorder: Secondary | ICD-10-CM

## 2022-06-30 DIAGNOSIS — N946 Dysmenorrhea, unspecified: Secondary | ICD-10-CM | POA: Diagnosis not present

## 2022-06-30 DIAGNOSIS — Z7689 Persons encountering health services in other specified circumstances: Secondary | ICD-10-CM | POA: Diagnosis not present

## 2022-06-30 MED ORDER — LEVONORGEST-ETH ESTRAD 91-DAY 0.15-0.03 &0.01 MG PO TABS: 1.0000 | ORAL_TABLET | Freq: Every day | ORAL | 4 refills | Status: DC

## 2022-06-30 NOTE — Progress Notes (Signed)
Patient ID: Jennifer Higgins, female   DOB: June 04, 1995, 27 y.o.   MRN: [id number] History of Present Illness: Jennifer Higgins is a 27 year old white female,single, G0P0, in for a well woman gyn exam and pap. She is on the Autism Spectrum and her guardian Jennifer Higgins is with her. She does good with camresse but it ran out.  PCP is Marjorie Smolder NP  Current Medications, Allergies, Past Medical History, Past Surgical History, Family History and Social History were reviewed in [employer] record.     Review of Systems: Patient denies any daily headaches, hearing loss, fatigue, blurred vision, shortness of breath, chest pain, abdominal pain, problems with bowel movements, urination, or intercourse(not having sex). No joint pain or mood swings.     Physical Exam:BP 114/81 (BP Location: Right Arm, Patient Position: Sitting, Cuff Size: Normal)   Pulse 91   Ht 5\' 5"  (1.651 m)   Wt 166 lb (75.3 kg)   BMI 27.62 kg/m   General:  Well developed, well nourished, no acute distress Skin:  Warm and dry Neck:  Midline trachea, normal thyroid, good ROM, no lymphadenopathy Lungs; Clear to auscultation bilaterally Breast:  No dominant palpable mass, retraction, or nipple discharge Cardiovascular: Regular rate and rhythm Abdomen:  Soft, non tender, no hepatosplenomegaly Pelvic:  External genitalia is normal in appearance, no lesions.  The vagina is normal in appearance. Urethra has no lesions or masses. The cervix is nulliparous, pap with GC/CHL and HR HPV genotyping performed.  Uterus is felt to be normal size, shape, and contour.  No adnexal masses or tenderness noted.Bladder is non tender, no masses felt. Extremities/musculoskeletal:  No swelling or varicosities noted, no clubbing or cyanosis Psych:  No mood changes, alert and cooperative,seems happy AA is 0 Fall risk is low    06/30/2022   12:31 PM 04/13/2022   10:22 AM 01/05/2022    1:43 PM  Depression screen PHQ 2/9  Decreased  Interest 0  0  Down, Depressed, Hopeless 0  0  PHQ - 2 Score 0  0  Altered sleeping 1  0  Tired, decreased energy 2  1  Change in appetite 1  0  Feeling bad or failure about yourself  0  0  Trouble concentrating 0  0  Moving slowly or fidgety/restless 0  0  Suicidal thoughts 0  0  PHQ-9 Score 4  1  Difficult doing work/chores   Not difficult at all     Information is confidential and restricted. Go to Review Flowsheets to unlock data.       06/30/2022   12:31 PM 04/13/2022   10:24 AM 04/13/2022   10:21 AM 01/05/2022    1:45 PM  GAD 7 : Generalized Anxiety Score  Nervous, Anxious, on Edge 0   1  Control/stop worrying 0   0  Worry too much - different things 0   0  Trouble relaxing 0   0  Restless 1   0  Easily annoyed or irritable 0   2  Afraid - awful might happen 0   0  Total GAD 7 Score 1   3  Anxiety Difficulty    Somewhat difficult     Information is confidential and restricted. Go to Review Flowsheets to unlock data.      Upstream - 06/30/22 1233       Pregnancy Intention Screening   Does the patient want to become pregnant in the next year? No    Does the  patient's partner want to become pregnant in the next year? No    Would the patient like to discuss contraceptive options today? No      Contraception Wrap Up   Current Method Oral Contraceptive;Abstinence    End Method Oral Contraceptive;Abstinence    Contraception Counseling Provided No    How was the end contraceptive method provided? Prescription            Examination chaperoned by Levy Pupa LPN  Impression and Plan: 1. Routine general medical examination at a health care facility Pap sent Pap in 3 years if normal Physical in 1 year  2. Encounter for routine gynecological examination with Papanicolaou smear of cervix Pap sent Pap in 3 years if normal Physical in 1 year  Labs with PCP  3. Autism spectrum disorder  4. Encounter for menstrual regulation Good with [facility] Refilled Camrese Meds  ordered this encounter  Medications   Levonorgestrel-Ethinyl Estradiol (CAMRESE) 0.15-0.03 &0.01 MG tablet    Sig: Take 1 tablet by mouth daily.    Dispense:  91 tablet    Refill:  4    Order Specific Question:   Supervising Provider    Answer:   Elonda Husky, LUTHER H [[id number]]     5. Dysmenorrhea Resolved with [facility]

## 2022-07-05 LAB — CYTOLOGY - PAP
Adequacy: ABSENT
Chlamydia: NEGATIVE
Comment: NEGATIVE
Comment: NEGATIVE
Comment: NORMAL
Diagnosis: NEGATIVE
High risk HPV: NEGATIVE
Neisseria Gonorrhea: NEGATIVE

## 2022-07-10 ENCOUNTER — Ambulatory Visit: Payer: Medicare Other | Admitting: Family Medicine

## 2022-07-12 ENCOUNTER — Encounter: Payer: Self-pay | Admitting: Family Medicine

## 2022-07-12 ENCOUNTER — Ambulatory Visit (INDEPENDENT_AMBULATORY_CARE_PROVIDER_SITE_OTHER): Payer: Medicare Other | Admitting: Family Medicine

## 2022-07-12 VITALS — BP 115/70 | HR 96 | Temp 98.2°F | Ht 65.0 in | Wt 171.1 lb

## 2022-07-12 DIAGNOSIS — K760 Fatty (change of) liver, not elsewhere classified: Secondary | ICD-10-CM

## 2022-07-12 DIAGNOSIS — E782 Mixed hyperlipidemia: Secondary | ICD-10-CM

## 2022-07-12 DIAGNOSIS — F840 Autistic disorder: Secondary | ICD-10-CM | POA: Diagnosis not present

## 2022-07-12 DIAGNOSIS — G43009 Migraine without aura, not intractable, without status migrainosus: Secondary | ICD-10-CM | POA: Diagnosis not present

## 2022-07-12 DIAGNOSIS — F39 Unspecified mood [affective] disorder: Secondary | ICD-10-CM | POA: Diagnosis not present

## 2022-07-12 DIAGNOSIS — K2100 Gastro-esophageal reflux disease with esophagitis, without bleeding: Secondary | ICD-10-CM | POA: Diagnosis not present
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
## 2022-07-14 DIAGNOSIS — Z23 Encounter for immunization: Secondary | ICD-10-CM | POA: Diagnosis not present

## 2022-07-25 ENCOUNTER — Telehealth (INDEPENDENT_AMBULATORY_CARE_PROVIDER_SITE_OTHER): Payer: Medicare Other | Admitting: Psychiatry

## 2022-07-25 ENCOUNTER — Encounter (HOSPITAL_COMMUNITY): Payer: Self-pay | Admitting: Psychiatry

## 2022-07-25 DIAGNOSIS — F39 Unspecified mood [affective] disorder: Secondary | ICD-10-CM

## 2022-07-25 DIAGNOSIS — F4010 Social phobia, unspecified: Secondary | ICD-10-CM | POA: Diagnosis not present

## 2022-07-25 DIAGNOSIS — E559 Vitamin D deficiency, unspecified: Secondary | ICD-10-CM

## 2022-07-25 DIAGNOSIS — F840 Autistic disorder: Secondary | ICD-10-CM

## 2022-07-25 DIAGNOSIS — F4323 Adjustment disorder with mixed anxiety and depressed mood: Secondary | ICD-10-CM | POA: Diagnosis not present

## 2022-07-25 DIAGNOSIS — F4310 Post-traumatic stress disorder, unspecified: Secondary | ICD-10-CM

## 2022-07-25 MED ORDER — CLONIDINE HCL 0.1 MG PO TABS: 0.1000 mg | ORAL_TABLET | Freq: Every day | ORAL | 1 refills | Status: DC

## 2022-07-25 MED ORDER — VORTIOXETINE HBR 20 MG PO TABS: 20.0000 mg | ORAL_TABLET | Freq: Every day | ORAL | 1 refills | Status: DC

## 2022-07-25 MED ORDER — AMANTADINE HCL 100 MG PO CAPS: 100.0000 mg | ORAL_CAPSULE | Freq: Two times a day (BID) | ORAL | 1 refills | Status: DC
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
## 2022-07-25 NOTE — Patient Instructions (Signed)
We did not make any medication changes today.  Keep up the good work with getting out more and opening up in psychotherapy.

## 2022-07-27 ENCOUNTER — Encounter: Payer: Self-pay | Admitting: Neurology

## 2022-07-27 ENCOUNTER — Ambulatory Visit (INDEPENDENT_AMBULATORY_CARE_PROVIDER_SITE_OTHER): Payer: Medicare Other | Admitting: Neurology

## 2022-07-27 VITALS — BP 118/82 | HR 86 | Ht 60.0 in | Wt 171.3 lb

## 2022-07-27 DIAGNOSIS — G43009 Migraine without aura, not intractable, without status migrainosus: Secondary | ICD-10-CM

## 2022-07-27 MED ORDER — GABAPENTIN 300 MG PO CAPS: ORAL_CAPSULE | ORAL | 3 refills | Status: DC

## 2022-07-27 MED ORDER — RIZATRIPTAN BENZOATE 10 MG PO TBDP: ORAL_TABLET | ORAL | 11 refills | Status: DC
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
## 2022-07-27 NOTE — Patient Instructions (Addendum)
Good to see you. Continue Gabapentin '300mg'$ : take 2 capsules every night. Refills also sent for Maxalt as needed for headache. Download the app Migraine Buddy to help track down your headaches. Follow-up in 8 months, call for any changes.

## 2022-08-02 ENCOUNTER — Ambulatory Visit (INDEPENDENT_AMBULATORY_CARE_PROVIDER_SITE_OTHER): Payer: Medicare Other | Admitting: Clinical

## 2022-08-02 DIAGNOSIS — F4323 Adjustment disorder with mixed anxiety and depressed mood: Secondary | ICD-10-CM | POA: Diagnosis not present

## 2022-08-02 DIAGNOSIS — F4010 Social phobia, unspecified: Secondary | ICD-10-CM | POA: Diagnosis not present

## 2022-08-02 DIAGNOSIS — F840 Autistic disorder: Secondary | ICD-10-CM

## 2022-08-02 DIAGNOSIS — F4310 Post-traumatic stress disorder, unspecified: Secondary | ICD-10-CM

## 2022-08-02 NOTE — Progress Notes (Signed)
IN PERSON   I connected with Jennifer [facility] on 08/02/22 at  1:00 PM EST in person and verified that I am speaking with the correct person using two identifiers.   Location: Patient: Office Provider: Office    I discussed the limitations of evaluation and management by telemedicine and the availability of in person appointments. The patient expressed understanding and agreed to proceed. ( IN PERSON)     THERAPY PROGRESS NOTE   Session Time: 1:00 PM- 1:45 PM   Participation Level: Active   Behavioral Response: CasualAlert/Anxious   Type of Therapy: Individual Therapy   Treatment Goals addressed: Coping for Adjustment / Anxiety / Autism   Interventions: CBT, DBT, Solution Focused, Strength-based and Supportive   Summary: Jennifer [facility] is a 27 y.o. female who presents with Adjustment Disorder with mixed  mood and Anxiety, [name] social anxiety (Autism),. The OPT therapist worked with the patient for her OPT treatment session. The OPT therapist utilized Motivational Interviewing to assist in creating therapeutic repore. The patient in the session was engaged and work in collaboration giving feedback about her triggers and symptoms over the past few weeks including conflict within the household between the patient and her cousin. The patient continued to identify potentially having to move if the in home conflict is not resolved and noted she felt like she was being treated as a teenager and wanted to be treated more like an adult. The patient spoke about working to be  active , independent, and social. The patient spoke about her social interactions. The patient continued reviewing her short term goals and the patient indicated work on [employer] and socialization as well as independence. The OPT therapist overviewed upcoming appointments as listed in the patients MyChart.   Suicidal/Homicidal: Nowithout intent/plan   Therapist Response:The OPT therapist worked with the patient for the patients  scheduled session. The patient was engaged in her session and gave feedback in relation to triggers, symptoms, and behavior responses over the past few weeks. The patient identified stressor of living in a home post her aunt passing where there is conflict with the other family members (cousins) who are currently in conflict and recently the patient has also been in conflict with her cousin due to feeling like she is being treated like a child/teen. The OPT therapist worked with the patient utilizing an in session Cognitive Behavioral Therapy exercise. The patient was responsive in the session and verbalized, " On my chore board it siad limit screen time to 2 hours and then she said I need to work on going to sleep earlier and getting up earlier ".  The patient continues to work on being independent including managing her money and budgeting while being task to share responsibilities in the home. The OPT therapist worked with the patient overviewing basic health areas including sleep cycle, eating habits, hygiene, and physical exercise. The patient in this session verbalized no current S/I or H/I.  The patient spoke about ongoing work to stay consistent with her hygiene.The OPT therapist will continue treatment work with the patient in her next scheduled session.   Plan: Return again in 2/3 weeks.   Diagnosis:      Axis I:Social Anxiety, [name] Adjustment Disorder with mixed mood and anxiety (ASD)                           Axis II: No diagnosis       Collaboration of Care: Overview of  medication management program involvement with psychiatrist Dr. Nehemiah Settle   Patient/Guardian was advised Release of Information must be obtained prior to any record release in order to collaborate their care with an outside provider. Patient/Guardian was advised if they have not already done so to contact the registration department to sign all necessary forms in order for [place] to release information regarding their care.     Consent: Patient/Guardian gives verbal consent for treatment and assignment of benefits for services provided during this visit. Patient/Guardian expressed understanding and agreed to proceed.    I discussed the assessment and treatment plan with the patient. The patient was provided an opportunity to ask questions and all were answered. The patient agreed with the plan and demonstrated an understanding of the instructions.   The patient was advised to call back or seek an in-person evaluation if the symptoms worsen or if the condition fails to improve as anticipated.   I provided 45 minutes of face-to-face time during this encounter.   Maye Hides, LCSW   08/02/2022

## 2022-09-07 ENCOUNTER — Ambulatory Visit (INDEPENDENT_AMBULATORY_CARE_PROVIDER_SITE_OTHER): Payer: Medicare Other | Admitting: Clinical

## 2022-09-07 DIAGNOSIS — F4010 Social phobia, unspecified: Secondary | ICD-10-CM | POA: Diagnosis not present

## 2022-09-07 DIAGNOSIS — F4310 Post-traumatic stress disorder, unspecified: Secondary | ICD-10-CM

## 2022-09-07 DIAGNOSIS — F4323 Adjustment disorder with mixed anxiety and depressed mood: Secondary | ICD-10-CM

## 2022-09-07 DIAGNOSIS — F840 Autistic disorder: Secondary | ICD-10-CM

## 2022-09-07 NOTE — Progress Notes (Signed)
IN PERSON   I connected with Jennifer [facility] on 09/07/22 at  2:00 PM EST in person and verified that I am speaking with the correct person using two identifiers.   Location: Patient: Office Provider: Office    I discussed the limitations of evaluation and management by telemedicine and the availability of in person appointments. The patient expressed understanding and agreed to proceed. ( IN PERSON)     THERAPY PROGRESS NOTE   Session Time: 2:00 PM- 2:45 PM   Participation Level: Active   Behavioral Response: CasualAlert/Anxious   Type of Therapy: Individual Therapy   Treatment Goals addressed: Coping for Adjustment / Anxiety / Autism   Interventions: CBT, DBT, Solution Focused, Strength-based and Supportive   Summary: Jennifer [facility] is a 27 y.o. female who presents with Adjustment Disorder with mixed  mood and Anxiety, [name] social anxiety (Autism),. The OPT therapist worked with the patient for her OPT treatment session. The OPT therapist utilized Motivational Interviewing to assist in creating therapeutic repore. The patient in the session was engaged and work in collaboration giving feedback about her triggers and symptoms over the past few weeks. The patient spoke about improvement with  conflict within the household between the patient and her cousin. The patient will be as of now able to continue to stay in the home and not moving out to an apartment with her cousin.. The patient spoke about working to be  active , independent, and social. The patient spoke about her social interactions. The patient continued reviewing her short term goals and the patient indicated work on [employer] and socialization ,independence, and budgeting. The OPT therapist overviewed upcoming appointments as listed in the patients MyChart including upcoming appointment for med therapy with Dr. Adrian Blackwater on 09/25/2022.   Suicidal/Homicidal: Nowithout intent/plan   Therapist Response:The OPT therapist worked with  the patient for the patients scheduled session. The patient was engaged in her session and gave feedback in relation to triggers, symptoms, and behavior responses over the past few weeks. The patient identified improvement and reduction in conflict with the other family members (cousins) post the decision to not move which was creating tension/stress for the patient and her roommate/cousin.. The OPT therapist worked with the patient utilizing an in session Cognitive Behavioral Therapy exercise. The patient was responsive in the session and verbalized, " I have been doing better with budgeting and I am utilizing an app to help as a tool for managing my money ".  The patient continues to work on being independent including managing her money and budgeting while being task to share responsibilities in the home. The OPT therapist worked with the patient overviewing basic health areas including sleep cycle, eating habits, hygiene, and physical exercise. The patient in this session verbalized no current S/I or H/I.  The patient spoke about ongoing work to stay consistent with her hygiene and body care.The OPT therapist will continue treatment work with the patient in her next scheduled session.   Plan: Return again in 2/3 weeks.   Diagnosis:      Axis I:Social Anxiety, [name] Adjustment Disorder with mixed mood and anxiety (ASD)                           Axis II: No diagnosis       Collaboration of Care: Overview of medication management program involvement with psychiatrist Dr. Adrian Blackwater   Patient/Guardian was advised Release of Information must be obtained prior to any  record release in order to collaborate their care with an outside provider. Patient/Guardian was advised if they have not already done so to contact the registration department to sign all necessary forms in order for [place] to release information regarding their care.    Consent: Patient/Guardian gives verbal consent for treatment and assignment of  benefits for services provided during this visit. Patient/Guardian expressed understanding and agreed to proceed.    I discussed the assessment and treatment plan with the patient. The patient was provided an opportunity to ask questions and all were answered. The patient agreed with the plan and demonstrated an understanding of the instructions.   The patient was advised to call back or seek an in-person evaluation if the symptoms worsen or if the condition fails to improve as anticipated.   I provided 45 minutes of face-to-face time during this encounter.   Suzan Garibaldi, LCSW   09/07/2022

## 2022-09-25 ENCOUNTER — Telehealth (INDEPENDENT_AMBULATORY_CARE_PROVIDER_SITE_OTHER): Payer: Medicare Other | Admitting: Psychiatry

## 2022-09-25 ENCOUNTER — Encounter (HOSPITAL_COMMUNITY): Payer: Self-pay | Admitting: Psychiatry

## 2022-09-25 DIAGNOSIS — F4010 Social phobia, unspecified: Secondary | ICD-10-CM

## 2022-09-25 DIAGNOSIS — F959 Tic disorder, unspecified: Secondary | ICD-10-CM

## 2022-09-25 DIAGNOSIS — F840 Autistic disorder: Secondary | ICD-10-CM

## 2022-09-25 DIAGNOSIS — F4323 Adjustment disorder with mixed anxiety and depressed mood: Secondary | ICD-10-CM | POA: Diagnosis not present

## 2022-09-25 DIAGNOSIS — F4310 Post-traumatic stress disorder, unspecified: Secondary | ICD-10-CM

## 2022-09-25 NOTE — Patient Instructions (Signed)
We did not make any medication changes today.
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
## 2022-10-26 ENCOUNTER — Ambulatory Visit (HOSPITAL_COMMUNITY): Payer: Medicare Other | Admitting: Clinical

## 2022-10-31 ENCOUNTER — Other Ambulatory Visit: Payer: Self-pay | Admitting: Gastroenterology

## 2022-10-31 NOTE — Telephone Encounter (Signed)
Needs ov

## 2022-11-17 ENCOUNTER — Ambulatory Visit: Payer: Medicare Other | Admitting: Gastroenterology

## 2022-11-29 ENCOUNTER — Ambulatory Visit (INDEPENDENT_AMBULATORY_CARE_PROVIDER_SITE_OTHER): Payer: Medicare Other | Admitting: Gastroenterology

## 2022-11-29 ENCOUNTER — Encounter: Payer: Self-pay | Admitting: Gastroenterology

## 2022-11-29 VITALS — BP 118/80 | HR 101 | Temp 98.2°F | Ht 60.0 in | Wt 172.2 lb

## 2022-11-29 DIAGNOSIS — K219 Gastro-esophageal reflux disease without esophagitis: Secondary | ICD-10-CM

## 2022-11-29 MED ORDER — PANTOPRAZOLE SODIUM 40 MG PO TBEC: 40.0000 mg | DELAYED_RELEASE_TABLET | Freq: Every day | ORAL | 11 refills | Status: AC

## 2022-11-29 NOTE — Patient Instructions (Signed)
Continue pantoprazole 40mg  daily before breakfast. We will follow up your upcoming liver labs. If abnormal, we will reach out with further recommendations. Return to the office in one year or sooner if needed.

## 2022-11-29 NOTE — Progress Notes (Signed)
GI Office Note    Referring Provider: Gabriel Earing, FNP Primary Care Physician:  Gabriel Earing, FNP  Primary Gastroenterologist: Roetta Sessions, MD   Chief Complaint   Chief Complaint  Patient presents with   Follow-up    Needs refills on pantoprazole.    History of Present Illness   Jennifer Higgins is a 27 y.o. female presenting today for follow-up.  She presents with her Child psychotherapist today.  She was last seen in November 2022.  She has a history of GERD, constipation, family history of Wilson's disease.  Patient reportedly was tested as a child for Wilson disease and was negative.  Those records are not available.  In August 2021 however 24-hour urinary copper was low at 9, ceruloplasmin 40, eye exam reportedly negative for [employer] rings.  Seems to be doing well from a reflux standpoint.  She tolerated once daily dosing.  Previously had required twice daily.  Because of significant ulcerative reflux esophagitis in the past, patient and social worker requesting stain on PPI.  Her heaviest she was around 185 pounds in 2022.  With dietary changes she has been able to get down to 166 but currently at 172.  She has significantly elevated cholesterol with LDL over 200.  Reportedly her LFTs increased on higher dose statins therefore her medications have been limited.  Bowel movements have been unremarkable.  No blood in the stool or melena.  No abdominal pain.  Sees PCP in August for yearly labs.    EGD October 2021: -Ulcerative/erosive reflux esophagitis with patulous EG junction -Small hiatal hernia  Medications   Current Outpatient Medications  Medication Sig Dispense Refill   amantadine (SYMMETREL) 100 MG capsule Take 1 capsule (100 mg total) by mouth 2 (two) times daily. 180 capsule 1   chlorhexidine (PERIDEX) 0.12 % solution SMARTSIG:0.5 Ounce(s) By Mouth Twice Daily     Cholecalciferol (VITAMIN D3 PO) Take 5,000 Units by mouth daily.      cloNIDine  (CATAPRES) 0.1 MG tablet Take 1 tablet (0.1 mg total) by mouth at bedtime. 90 tablet 1   DENTA 5000 PLUS 1.1 % CREA dental cream      gabapentin (NEURONTIN) 300 MG capsule Take 2 capsules every night 180 capsule 3   Levonorgestrel-Ethinyl Estradiol (CAMRESE) 0.15-0.03 &0.01 MG tablet Take 1 tablet by mouth daily. 91 tablet 4   Omega-3 1000 MG CAPS Take by mouth daily.      pantoprazole (PROTONIX) 40 MG tablet TAKE 1 TABLET BY MOUTH ONCE A DAY 30 tablet 1   rizatriptan (MAXALT-MLT) 10 MG disintegrating tablet Take 1 tablet at onset of migraine. May repeat in 2 hours if needed. Do not take more than 3 a week 10 tablet 11   rosuvastatin (CRESTOR) 10 MG tablet Take 1 tablet (10 mg total) by mouth daily. 90 tablet 3   senna (SENOKOT) 8.6 MG tablet Take 1 tablet by mouth as needed.      vortioxetine HBr (TRINTELLIX) 20 MG TABS tablet Take 1 tablet (20 mg total) by mouth daily. 90 tablet 1   No current facility-administered medications for this visit.    Allergies   Allergies as of 11/29/2022   (No Known Allergies)       Review of Systems   General: Negative for anorexia, weight loss, fever, chills, fatigue, weakness. ENT: Negative for hoarseness, difficulty swallowing , nasal congestion. CV: Negative for chest pain, angina, palpitations, dyspnea on exertion, peripheral edema.  Respiratory: Negative for dyspnea  at rest, dyspnea on exertion, cough, sputum, wheezing.  GI: See history of present illness. GU:  Negative for dysuria, hematuria, urinary incontinence, urinary frequency, nocturnal urination.  Endo: Negative for unusual weight change.     Physical Exam   BP 118/80 (BP Location: Right Arm, Patient Position: Sitting, Cuff Size: Normal)   Pulse (!) 101   Temp 98.2 F (36.8 C) (Oral)   Ht 5' (1.524 m)   Wt 172 lb 3.2 oz (78.1 kg)   LMP  (LMP Unknown)   SpO2 98%   BMI 33.63 kg/m    General: Well-nourished, well-developed in no acute distress. Cooperative. Provides limited  details today. Poor eye contact. Eyes: No icterus. Mouth: Oropharyngeal mucosa moist and pink   Abdomen: Bowel sounds are normal, nontender, nondistended, no hepatosplenomegaly or masses,  no abdominal bruits or hernia , no rebound or guarding.  Rectal: not performed Extremities: No lower extremity edema. No clubbing or deformities. Neuro: Alert and oriented x 4   Skin: Warm and dry, no jaundice.   Psych: Alert and cooperative, normal mood and affect.  Labs   Lab Results  Component Value Date   CREATININE 0.85 01/05/2022   BUN 5 (L) 01/05/2022   NA 139 01/05/2022   K 4.7 01/05/2022   CL 102 01/05/2022   CO2 22 01/05/2022   Lab Results  Component Value Date   ALT 6 01/05/2022   AST 14 01/05/2022   ALKPHOS 73 01/05/2022   BILITOT 0.3 01/05/2022   Lab Results  Component Value Date   WBC 6.9 01/05/2022   HGB 14.0 01/05/2022   HCT 44.1 01/05/2022   MCV 86 01/05/2022   PLT 370 01/05/2022    Imaging Studies   No results found.  Assessment   GERD: History of ulcerative/erosive reflux esophagitis, gastritis on EGD in 2021.  Overall doing better.  She denies any heartburn.  Vomiting intermittently potentially related to reflux and/or anxiety per Child psychotherapist.  They are apprehensive regarding discontinuing PPI therapy.  Will continue for now.  Constipation: Doing well.  Intermittent elevated LFTs: Possible slight echogenicity of the liver on ultrasound.  History of mildly elevated transaminases in the past with most recent LFTs in August 2023.  Total cholesterol 279, LDL 206 at that time as well.  Currently on low-dose Crestor 10 mg daily. Will follow up upcoming labs.  FH Wilson's Disease: Mother, maternal aunt, maternal aunt.  In 2021, 24-hour urinary copper level 9, ceruloplasmin 40, reportedly negative KF rings. Previously tried to have patient see genetic counselor locally but they declined evaluating patient reporting that test only for FH of cancers.    PLAN    Continue pantoprazole 40mg  daily.  Follow up labs planned for next month. Return ov in one year or sooner if needed.   Leanna Battles. Melvyn Neth, MHS, PA-C [facility]

## 2022-12-06 ENCOUNTER — Ambulatory Visit (HOSPITAL_COMMUNITY): Payer: Medicare Other | Admitting: Clinical

## 2023-01-10 ENCOUNTER — Encounter: Payer: Self-pay | Admitting: Family Medicine

## 2023-01-10 ENCOUNTER — Ambulatory Visit (INDEPENDENT_AMBULATORY_CARE_PROVIDER_SITE_OTHER): Payer: Medicare Other | Admitting: Family Medicine

## 2023-01-10 VITALS — BP 106/77 | HR 100 | Temp 98.1°F | Ht 60.0 in | Wt 166.1 lb

## 2023-01-10 DIAGNOSIS — Z1329 Encounter for screening for other suspected endocrine disorder: Secondary | ICD-10-CM | POA: Diagnosis not present

## 2023-01-10 DIAGNOSIS — Z6832 Body mass index (BMI) 32.0-32.9, adult: Secondary | ICD-10-CM | POA: Diagnosis not present

## 2023-01-10 DIAGNOSIS — E6609 Other obesity due to excess calories: Secondary | ICD-10-CM

## 2023-01-10 DIAGNOSIS — Z130 Encounter for screening for diseases of the blood and blood-forming organs and certain disorders involving the immune mechanism: Secondary | ICD-10-CM | POA: Diagnosis not present

## 2023-01-10 DIAGNOSIS — Z0000 Encounter for general adult medical examination without abnormal findings: Secondary | ICD-10-CM

## 2023-01-10 DIAGNOSIS — E66811 Obesity, class 1: Secondary | ICD-10-CM

## 2023-01-10 DIAGNOSIS — Z13228 Encounter for screening for other metabolic disorders: Secondary | ICD-10-CM | POA: Diagnosis not present

## 2023-01-10 DIAGNOSIS — E559 Vitamin D deficiency, unspecified: Secondary | ICD-10-CM

## 2023-01-10 DIAGNOSIS — F4323 Adjustment disorder with mixed anxiety and depressed mood: Secondary | ICD-10-CM

## 2023-01-10 DIAGNOSIS — E782 Mixed hyperlipidemia: Secondary | ICD-10-CM | POA: Diagnosis not present

## 2023-01-10 DIAGNOSIS — R7309 Other abnormal glucose: Secondary | ICD-10-CM | POA: Diagnosis not present

## 2023-01-10 LAB — BAYER DCA HB A1C WAIVED: HB A1C (BAYER DCA - WAIVED): 5 % (ref 4.8–5.6)

## 2023-01-10 NOTE — Patient Instructions (Signed)
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
## 2023-01-11 ENCOUNTER — Other Ambulatory Visit: Payer: Self-pay | Admitting: Family Medicine

## 2023-01-11 DIAGNOSIS — E559 Vitamin D deficiency, unspecified: Secondary | ICD-10-CM

## 2023-01-11 LAB — CBC WITH DIFFERENTIAL/PLATELET
Basophils Absolute: 0 10*3/uL (ref 0.0–0.2)
Basos: 1 %
EOS (ABSOLUTE): 0.3 10*3/uL (ref 0.0–0.4)
Eos: 4 %
Hematocrit: 46.9 % — ABNORMAL HIGH (ref 34.0–46.6)
Hemoglobin: 15.2 g/dL (ref 11.1–15.9)
Immature Grans (Abs): 0 10*3/uL (ref 0.0–0.1)
Immature Granulocytes: 0 %
Lymphocytes Absolute: 2.5 10*3/uL (ref 0.7–3.1)
Lymphs: 33 %
MCH: 29 pg (ref 26.6–33.0)
MCHC: 32.4 g/dL (ref 31.5–35.7)
MCV: 90 fL (ref 79–97)
Monocytes Absolute: 0.7 10*3/uL (ref 0.1–0.9)
Monocytes: 9 %
Neutrophils Absolute: 4 10*3/uL (ref 1.4–7.0)
Neutrophils: 53 %
Platelets: 334 10*3/uL (ref 150–450)
RBC: 5.24 x10E6/uL (ref 3.77–5.28)
RDW: 12.2 % (ref 11.7–15.4)
WBC: 7.6 10*3/uL (ref 3.4–10.8)

## 2023-01-11 LAB — LIPID PANEL
Chol/HDL Ratio: 3 ratio (ref 0.0–4.4)
Cholesterol, Total: 161 mg/dL (ref 100–199)
HDL: 53 mg/dL (ref >39)
LDL Chol Calc (NIH): 89 mg/dL (ref 0–99)
Triglycerides: 103 mg/dL (ref 0–149)
VLDL Cholesterol Cal: 19 mg/dL (ref 5–40)

## 2023-01-11 LAB — VITAMIN D 25 HYDROXY (VIT D DEFICIENCY, FRACTURES): Vit D, 25-Hydroxy: 28.4 ng/mL — ABNORMAL LOW (ref 30.0–100.0)

## 2023-01-11 LAB — TSH: TSH: 2.04 u[IU]/mL (ref 0.450–4.500)

## 2023-01-11 LAB — CMP14+EGFR
ALT: 11 IU/L (ref 0–32)
AST: 18 IU/L (ref 0–40)
Albumin: 4.6 g/dL (ref 4.0–5.0)
Alkaline Phosphatase: 83 IU/L (ref 44–121)
BUN/Creatinine Ratio: 9 (ref 9–23)
BUN: 8 mg/dL (ref 6–20)
Bilirubin Total: 0.4 mg/dL (ref 0.0–1.2)
CO2: 26 mmol/L (ref 20–29)
Calcium: 9.5 mg/dL (ref 8.7–10.2)
Chloride: 101 mmol/L (ref 96–106)
Creatinine, Ser: 0.85 mg/dL (ref 0.57–1.00)
Globulin, Total: 2.4 g/dL (ref 1.5–4.5)
Glucose: 88 mg/dL (ref 70–99)
Potassium: 4.3 mmol/L (ref 3.5–5.2)
Sodium: 139 mmol/L (ref 134–144)
Total Protein: 7 g/dL (ref 6.0–8.5)
eGFR: 97 mL/min/{1.73_m2} (ref >59)

## 2023-01-11 MED ORDER — VITAMIN D (ERGOCALCIFEROL) 1.25 MG (50000 UNIT) PO CAPS
50000.0000 [IU] | ORAL_CAPSULE | ORAL | 0 refills | Status: DC
Start: 2023-01-11 — End: 2024-01-11

## 2023-01-15 NOTE — Addendum Note (Signed)
Addended by: Gabriel Earing on: 01/15/2023 01:57 PM   Modules accepted: Level of Service

## 2023-01-16 ENCOUNTER — Telehealth (INDEPENDENT_AMBULATORY_CARE_PROVIDER_SITE_OTHER): Payer: Medicare Other | Admitting: Psychiatry

## 2023-01-16 ENCOUNTER — Encounter (HOSPITAL_COMMUNITY): Payer: Self-pay | Admitting: Psychiatry

## 2023-01-16 DIAGNOSIS — F4010 Social phobia, unspecified: Secondary | ICD-10-CM

## 2023-01-16 DIAGNOSIS — F331 Major depressive disorder, recurrent, moderate: Secondary | ICD-10-CM

## 2023-01-16 DIAGNOSIS — R45851 Suicidal ideations: Secondary | ICD-10-CM | POA: Insufficient documentation

## 2023-01-16 DIAGNOSIS — R519 Headache, unspecified: Secondary | ICD-10-CM

## 2023-01-16 DIAGNOSIS — E785 Hyperlipidemia, unspecified: Secondary | ICD-10-CM | POA: Diagnosis not present

## 2023-01-16 DIAGNOSIS — F4310 Post-traumatic stress disorder, unspecified: Secondary | ICD-10-CM

## 2023-01-16 DIAGNOSIS — F940 Selective mutism: Secondary | ICD-10-CM

## 2023-01-16 DIAGNOSIS — F419 Anxiety disorder, unspecified: Secondary | ICD-10-CM | POA: Diagnosis not present

## 2023-01-16 DIAGNOSIS — F840 Autistic disorder: Secondary | ICD-10-CM

## 2023-01-16 DIAGNOSIS — H9325 Central auditory processing disorder: Secondary | ICD-10-CM

## 2023-01-16 MED ORDER — ARIPIPRAZOLE 2 MG PO TABS
2.0000 mg | ORAL_TABLET | Freq: Every evening | ORAL | 2 refills | Status: DC
Start: 2023-01-16 — End: 2023-04-19
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
## 2023-01-16 NOTE — Patient Instructions (Signed)
We restarted abilify (aripiprazole) 2mg  daily to your regimen today. You can take this at night if that is easier. When you get a chance, please coordinate with your PCP to get an updated EKG while you are taking the abilify.

## 2023-01-19 ENCOUNTER — Telehealth (HOSPITAL_COMMUNITY): Payer: Medicare Other | Admitting: Psychiatry

## 2023-01-24 ENCOUNTER — Ambulatory Visit (INDEPENDENT_AMBULATORY_CARE_PROVIDER_SITE_OTHER): Payer: Medicare Other

## 2023-01-24 VITALS — Ht 65.0 in | Wt 166.0 lb

## 2023-01-24 DIAGNOSIS — Z0000 Encounter for general adult medical examination without abnormal findings: Secondary | ICD-10-CM | POA: Diagnosis not present

## 2023-01-24 NOTE — Patient Instructions (Signed)
Ms. Jennifer Higgins , Thank you for taking time to come for your Medicare Wellness Visit. I appreciate your ongoing commitment to your health goals. Please review the following plan we discussed and let me know if I can assist you in the future.   Referrals/Orders/Follow-Ups/Clinician Recommendations: Aim for 30 minutes of exercise or brisk walking, 6-8 glasses of water, and 5 servings of fruits and vegetables each day.   This is a list of the screening recommended for you and due dates:  Health Maintenance  Topic Date Due   HPV Vaccine (1 - 3-dose series) 07/13/2023*   Hepatitis C Screening  07/13/2023*   HIV Screening  07/13/2023*   COVID-19 Vaccine (1 - 2023-24 season) 07/28/2023*   Flu Shot  08/27/2023*   Medicare Annual Wellness Visit  01/24/2024   Pap Smear  06/30/2025   Pap Smear  06/30/2025   DTaP/Tdap/Td vaccine (2 - Tdap) 07/30/2029  *Topic was postponed. The date shown is not the original due date.    Advanced directives: (Provided) Advance directive discussed with you today. I have provided a copy for you to complete at home and have notarized. Once this is complete, please bring a copy in to our office so we can scan it into your chart. Information on Advanced Care Planning can be found at [facility] Advance Health Care Directives Advance Health Care Directives ([email or web address]    Next Medicare Annual Wellness Visit scheduled for next year: Yes insert Preventive Care Attachment Reference
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
## 2023-01-30 ENCOUNTER — Other Ambulatory Visit: Payer: Self-pay | Admitting: Family Medicine

## 2023-01-30 DIAGNOSIS — E782 Mixed hyperlipidemia: Secondary | ICD-10-CM

## 2023-02-07 ENCOUNTER — Ambulatory Visit (HOSPITAL_COMMUNITY): Payer: Medicare Other | Admitting: Clinical

## 2023-03-14 ENCOUNTER — Ambulatory Visit (INDEPENDENT_AMBULATORY_CARE_PROVIDER_SITE_OTHER): Payer: 59 | Admitting: Clinical

## 2023-03-14 DIAGNOSIS — F840 Autistic disorder: Secondary | ICD-10-CM

## 2023-03-14 DIAGNOSIS — F4310 Post-traumatic stress disorder, unspecified: Secondary | ICD-10-CM

## 2023-03-14 DIAGNOSIS — F331 Major depressive disorder, recurrent, moderate: Secondary | ICD-10-CM

## 2023-03-14 DIAGNOSIS — F4010 Social phobia, unspecified: Secondary | ICD-10-CM

## 2023-03-14 NOTE — Progress Notes (Signed)
IN PERSON   I connected with Jennifer Higgins on 03/14/23 at 11:00 AM EDT in person  and verified that I am speaking with the correct person using two identifiers.  Location: Patient: office Provider: office    I discussed the limitations of evaluation and management by telemedicine and the availability of in person appointments. The patient expressed understanding and agreed to proceed.
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
## 2023-03-27 ENCOUNTER — Encounter: Payer: Self-pay | Admitting: Neurology

## 2023-03-27 ENCOUNTER — Ambulatory Visit (INDEPENDENT_AMBULATORY_CARE_PROVIDER_SITE_OTHER): Payer: 59 | Admitting: Neurology

## 2023-03-27 VITALS — BP 106/62 | HR 120 | Ht 65.0 in | Wt 166.2 lb

## 2023-03-27 DIAGNOSIS — G43009 Migraine without aura, not intractable, without status migrainosus: Secondary | ICD-10-CM

## 2023-03-27 MED ORDER — GABAPENTIN 300 MG PO CAPS: ORAL_CAPSULE | ORAL | 3 refills | Status: AC

## 2023-03-27 MED ORDER — RIZATRIPTAN BENZOATE 10 MG PO TBDP: ORAL_TABLET | ORAL | 11 refills | Status: DC

## 2023-03-27 NOTE — Patient Instructions (Signed)
Good to see you! Continue all your medications. Follow-up in 1 year, call for any changes.  
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
## 2023-04-19 ENCOUNTER — Other Ambulatory Visit (HOSPITAL_COMMUNITY): Payer: Self-pay | Admitting: Psychiatry

## 2023-04-19 DIAGNOSIS — F39 Unspecified mood [affective] disorder: Secondary | ICD-10-CM

## 2023-04-19 DIAGNOSIS — F331 Major depressive disorder, recurrent, moderate: Secondary | ICD-10-CM

## 2023-04-19 DIAGNOSIS — F840 Autistic disorder: Secondary | ICD-10-CM

## 2023-04-19 DIAGNOSIS — R45851 Suicidal ideations: Secondary | ICD-10-CM

## 2023-04-19 DIAGNOSIS — F4010 Social phobia, unspecified: Secondary | ICD-10-CM

## 2023-04-19 DIAGNOSIS — F4310 Post-traumatic stress disorder, unspecified: Secondary | ICD-10-CM

## 2023-05-02 ENCOUNTER — Ambulatory Visit (HOSPITAL_COMMUNITY): Payer: 59 | Admitting: Clinical

## 2023-05-02 DIAGNOSIS — F4010 Social phobia, unspecified: Secondary | ICD-10-CM

## 2023-05-02 DIAGNOSIS — F331 Major depressive disorder, recurrent, moderate: Secondary | ICD-10-CM | POA: Diagnosis not present

## 2023-05-02 DIAGNOSIS — F840 Autistic disorder: Secondary | ICD-10-CM

## 2023-05-02 DIAGNOSIS — F4310 Post-traumatic stress disorder, unspecified: Secondary | ICD-10-CM | POA: Diagnosis not present

## 2023-05-02 NOTE — Progress Notes (Signed)
Virtual Visit via Telephone Note  I connected with Jennifer Higgins on 05/02/23 at 11:00 AM EST by telephone and verified that I am speaking with the correct person using two identifiers.  Location: Patient: home Provider: office   I discussed the limitations, risks, security and privacy concerns of performing an evaluation and management service by telephone and the availability of in person appointments. I also discussed with the patient that there may be a patient responsible charge related to this service. The patient expressed understanding and agreed to proceed.    THERAPY PROGRESS NOTE   Session Time: 11:00 AM- 11:30 AM   Participation Level: Active   Behavioral Response: CasualAlert/Anxious   Type of Therapy: Individual Therapy   Treatment Goals addressed: Coping for Adjustment / Anxiety / Autism   Interventions: CBT, DBT, Solution Focused, Strength-based and Supportive   Summary: Jennifer Higgins is a 27 y.o. female who presents with Adjustment Disorder with mixed  mood and Anxiety, [name] social anxiety (Autism),. The OPT therapist worked with the patient for her OPT treatment session. The OPT therapist utilized Motivational Interviewing to assist in creating therapeutic repore. The patient in the session was engaged and work in collaboration giving feedback about her triggers and symptoms over the past few weeks. The patient spoke about improvement with  conflict within the household between the patient and her cousin.The patient spoke about Thanksgiving holiday and looking forward to upcoming Christmas holiday.The patient spoke about her social interactions via online gaming with friends. The patient continued reviewing her short term goals and the patient indicated work on [employer] and socialization ,independence, and budgeting. The OPT therapist overviewed upcoming appointments as listed in the patients MyChart.   Suicidal/Homicidal: Nowithout intent/plan   Therapist  Response:The OPT therapist worked with the patient for the patients scheduled session. The patient was engaged in her session and gave feedback in relation to triggers, symptoms, and behavior responses over the past few weeks. The patient identified improvement and reduction in conflict with the other family members (cousins) which was creating tension/stress for the patient and her roommate/cousin.. The OPT therapist worked with the patient utilizing an in session Cognitive Behavioral Therapy exercise. The patient was responsive in the session and verbalized, " Things have been better lately they have not been treating me like a child and telling me what to do so we are getting along better... Thanksgiving went good.. We are hosting for Christmas here and we have the house decorated ".  The patient continues to work on being independent including managing her money and budgeting while being task to share responsibilities in the home. The OPT therapist worked with the patient overviewing basic health areas including sleep cycle, eating habits, hygiene, and physical exercise. The patient in this session verbalized no current S/I or H/I.  The patient spoke about ongoing work to stay consistent with her hygiene and body care.The OPT therapist will continue treatment work with the patient in her next scheduled session.   Plan: Return again in 2/3 weeks.   Diagnosis:      Axis I:Social Anxiety, [name] Adjustment Disorder with mixed mood and anxiety (ASD)                           Axis II: No diagnosis       Collaboration of Care: No additional collaboration for this session.   Patient/Guardian was advised Release of Information must be obtained prior to any record release in  order to collaborate their care with an outside provider. Patient/Guardian was advised if they have not already done so to contact the registration department to sign all necessary forms in order for [place] to release information regarding  their care.    Consent: Patient/Guardian gives verbal consent for treatment and assignment of benefits for services provided during this visit. Patient/Guardian expressed understanding and agreed to proceed.    I discussed the assessment and treatment plan with the patient. The patient was provided an opportunity to ask questions and all were answered. The patient agreed with the plan and demonstrated an understanding of the instructions.   The patient was advised to call back or seek an in-person evaluation if the symptoms worsen or if the condition fails to improve as anticipated.   I provided 30 minutes of non-face-to-face time during this encounter.   Suzan Garibaldi, LCSW   05/02/2023

## 2023-05-09 ENCOUNTER — Encounter (HOSPITAL_COMMUNITY): Payer: Self-pay

## 2023-05-10 ENCOUNTER — Telehealth (HOSPITAL_COMMUNITY): Payer: 59 | Admitting: Psychiatry

## 2023-05-10 ENCOUNTER — Encounter (HOSPITAL_COMMUNITY): Payer: Self-pay | Admitting: Psychiatry

## 2023-05-10 DIAGNOSIS — F4010 Social phobia, unspecified: Secondary | ICD-10-CM

## 2023-05-10 DIAGNOSIS — R45851 Suicidal ideations: Secondary | ICD-10-CM | POA: Diagnosis not present

## 2023-05-10 DIAGNOSIS — F332 Major depressive disorder, recurrent severe without psychotic features: Secondary | ICD-10-CM | POA: Diagnosis not present

## 2023-05-10 DIAGNOSIS — Z79899 Other long term (current) drug therapy: Secondary | ICD-10-CM | POA: Insufficient documentation

## 2023-05-10 DIAGNOSIS — F331 Major depressive disorder, recurrent, moderate: Secondary | ICD-10-CM

## 2023-05-10 DIAGNOSIS — F840 Autistic disorder: Secondary | ICD-10-CM

## 2023-05-10 DIAGNOSIS — H9325 Central auditory processing disorder: Secondary | ICD-10-CM

## 2023-05-10 DIAGNOSIS — R634 Abnormal weight loss: Secondary | ICD-10-CM | POA: Insufficient documentation

## 2023-05-10 DIAGNOSIS — F4310 Post-traumatic stress disorder, unspecified: Secondary | ICD-10-CM

## 2023-05-10 NOTE — Patient Instructions (Addendum)
We will plan on increasing the Abilify to 5 mg nightly once we are able to get consent from patient's current guardian.  If you are able to contact the patient's current guardian it would be very helpful to get updated paperwork for [place] to be able to speak with patient's cousin while we are waiting for her to become guardian.  Also please store all sharp objects securely when not in use and would be better to dispense medication to patient while there is ongoing suicidal ideation.  If these thoughts were to worsen I would present to the emergency department and [facility] has a behavioral health urgent care in [place] that you can utilize: [email or web address]  This is open 24 hours a day 7 days a week.  Reuel Boom will also need an updated EKG, lipid panel, A1c while she is on the Abilify if you can coordinate with your PCP to get these.  With a 20 pound unintentional weight loss I sent a message to her PCP as getting nutrition services involved in checking some more blood work is probably necessary.  If that were to continue then hospitalization would also be the next step and ECT would be the quickest way to reverse the dangerous weight loss; I am hopeful that we can start the Abilify soon and get some improvement as it sounds like it was effective with a similar presentation in August.
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
## 2023-05-11 MED ORDER — ARIPIPRAZOLE 5 MG PO TABS: 5.0000 mg | ORAL_TABLET | Freq: Every evening | ORAL | 2 refills | Status: DC

## 2023-05-11 NOTE — Addendum Note (Signed)
Addended by: Tia Masker on: 05/11/2023 11:06 AM   Modules accepted: Orders

## 2023-05-11 NOTE — Telephone Encounter (Signed)
Spoke with [name] advised of your message she agrees she also filled out release for the other [place] as well

## 2023-05-24 ENCOUNTER — Encounter (HOSPITAL_COMMUNITY): Payer: Self-pay | Admitting: Psychiatry

## 2023-05-24 ENCOUNTER — Telehealth (HOSPITAL_COMMUNITY): Payer: 59 | Admitting: Psychiatry

## 2023-05-24 DIAGNOSIS — F4310 Post-traumatic stress disorder, unspecified: Secondary | ICD-10-CM

## 2023-05-24 DIAGNOSIS — F840 Autistic disorder: Secondary | ICD-10-CM | POA: Diagnosis not present

## 2023-05-24 DIAGNOSIS — R634 Abnormal weight loss: Secondary | ICD-10-CM

## 2023-05-24 DIAGNOSIS — H9325 Central auditory processing disorder: Secondary | ICD-10-CM

## 2023-05-24 DIAGNOSIS — F4010 Social phobia, unspecified: Secondary | ICD-10-CM

## 2023-05-24 DIAGNOSIS — Z79899 Other long term (current) drug therapy: Secondary | ICD-10-CM | POA: Diagnosis not present

## 2023-05-24 DIAGNOSIS — F332 Major depressive disorder, recurrent severe without psychotic features: Secondary | ICD-10-CM | POA: Diagnosis not present

## 2023-05-24 NOTE — Patient Instructions (Addendum)
We did not make any medication changes today.  You may want to consider adding MiraLAX daily to her regimen in case the Abilify makes her more constipated.  When you get a chance please coordinate with her PCP to get an updated lipid panel, A1c, EKG and send it over to our office for monitoring while on Abilify.
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
## 2023-06-20 ENCOUNTER — Encounter (HOSPITAL_COMMUNITY): Payer: Self-pay

## 2023-06-26 ENCOUNTER — Telehealth (HOSPITAL_COMMUNITY): Payer: 59 | Admitting: Psychiatry

## 2023-06-26 ENCOUNTER — Encounter (HOSPITAL_COMMUNITY): Payer: Self-pay | Admitting: Psychiatry

## 2023-06-26 DIAGNOSIS — F4310 Post-traumatic stress disorder, unspecified: Secondary | ICD-10-CM

## 2023-06-26 DIAGNOSIS — F332 Major depressive disorder, recurrent severe without psychotic features: Secondary | ICD-10-CM | POA: Diagnosis not present

## 2023-06-26 DIAGNOSIS — F840 Autistic disorder: Secondary | ICD-10-CM

## 2023-06-26 DIAGNOSIS — F4010 Social phobia, unspecified: Secondary | ICD-10-CM | POA: Diagnosis not present

## 2023-06-26 DIAGNOSIS — H9325 Central auditory processing disorder: Secondary | ICD-10-CM

## 2023-06-26 DIAGNOSIS — Z79899 Other long term (current) drug therapy: Secondary | ICD-10-CM

## 2023-06-26 DIAGNOSIS — R634 Abnormal weight loss: Secondary | ICD-10-CM
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
## 2023-06-26 NOTE — Patient Instructions (Addendum)
We recommended hospitalization today.  Please present to [facility] emergency department which can be found here: [facility] [address], [place], [place] [address]

## 2023-07-17 ENCOUNTER — Telehealth (HOSPITAL_COMMUNITY): Payer: 59 | Admitting: Psychiatry

## 2023-07-17 DIAGNOSIS — F331 Major depressive disorder, recurrent, moderate: Secondary | ICD-10-CM

## 2023-07-17 DIAGNOSIS — F840 Autistic disorder: Secondary | ICD-10-CM | POA: Diagnosis not present

## 2023-07-17 DIAGNOSIS — F4310 Post-traumatic stress disorder, unspecified: Secondary | ICD-10-CM

## 2023-07-17 DIAGNOSIS — R45851 Suicidal ideations: Secondary | ICD-10-CM

## 2023-07-17 DIAGNOSIS — F4010 Social phobia, unspecified: Secondary | ICD-10-CM | POA: Diagnosis not present

## 2023-07-17 DIAGNOSIS — R634 Abnormal weight loss: Secondary | ICD-10-CM

## 2023-07-17 DIAGNOSIS — F39 Unspecified mood [affective] disorder: Secondary | ICD-10-CM

## 2023-07-17 DIAGNOSIS — H9325 Central auditory processing disorder: Secondary | ICD-10-CM

## 2023-07-17 DIAGNOSIS — Z79899 Other long term (current) drug therapy: Secondary | ICD-10-CM

## 2023-07-17 DIAGNOSIS — F332 Major depressive disorder, recurrent severe without psychotic features: Secondary | ICD-10-CM

## 2023-07-17 MED ORDER — VORTIOXETINE HBR 20 MG PO TABS: 20.0000 mg | ORAL_TABLET | Freq: Every day | ORAL | 2 refills | Status: DC

## 2023-07-17 MED ORDER — AMANTADINE HCL 100 MG PO CAPS: 100.0000 mg | ORAL_CAPSULE | Freq: Two times a day (BID) | ORAL | 1 refills | Status: AC

## 2023-07-17 MED ORDER — ARIPIPRAZOLE 5 MG PO TABS: 5.0000 mg | ORAL_TABLET | Freq: Every evening | ORAL | 2 refills | Status: DC

## 2023-07-17 MED ORDER — CLONIDINE HCL 0.1 MG PO TABS: 0.1000 mg | ORAL_TABLET | Freq: Every day | ORAL | 1 refills | Status: DC
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
## 2023-07-17 NOTE — Patient Instructions (Addendum)
[email or web address]  [email or web address]&ai=[email or web address]&ae=2&co=1&gclid=[id number]&ohost=www.[email or web address]&cid=CAASJeRoEML1haMDiygdU-zYRJXKerG7Xyd5pquFj9VtFVXuODWg0BQ&sig=[email or web address]&q&adurl&ved=[id number]&nis=7&dct=1&suid=[id number]  [email or web address]?[email or web address]&ai=DChcSEwjiysmy_M2LAxXULdQBHT3lENIYABADGgJvYQ&ae=2&co=1&gclid=E[id number]_BwE&ohost=www.google.com&cid=CAASJeRoEML1haMDiygdU-zYRJXKerG7Xyd5pquFj9VtFVXuODWg0BQ&sig=[email or web address]_bnsPwYQ&q&adurl&ved=2ahUKEwjpg8Wy_M2LAxXWL9AFHVvGJaIQ0Qx6BAgOEAQ&nis=7&dct=1&suid=[id number]  We otherwise did not make medication changes today and will encourage ongoing compliance first. Please coordinate with her PCP for a nutrition referral.

## 2023-07-18 ENCOUNTER — Encounter (HOSPITAL_COMMUNITY): Payer: Self-pay | Admitting: Psychiatry

## 2023-07-25 ENCOUNTER — Ambulatory Visit (INDEPENDENT_AMBULATORY_CARE_PROVIDER_SITE_OTHER): Payer: 59 | Admitting: Clinical

## 2023-07-25 DIAGNOSIS — F4310 Post-traumatic stress disorder, unspecified: Secondary | ICD-10-CM | POA: Diagnosis not present

## 2023-07-25 DIAGNOSIS — F840 Autistic disorder: Secondary | ICD-10-CM | POA: Diagnosis not present

## 2023-07-25 DIAGNOSIS — F4323 Adjustment disorder with mixed anxiety and depressed mood: Secondary | ICD-10-CM

## 2023-07-25 NOTE — Progress Notes (Signed)
 IN PERSON   I connected with Jennifer Higgins on 07/25/23 at  2:00 PM ESTin person and verified that I am speaking with the correct person using two identifiers.  Location: Patient: office Provider: office    I discussed the limitations of evaluation and management by telemedicine and the availability of in person appointments. The patient expressed understanding and agreed to proceed. ( IN PERSON)     THERAPY PROGRESS NOTE   Session Time: 2:00 AM- 2:38 AM   Participation Level: Active   Behavioral Response: CasualAlert/Anxious   Type of Therapy: Individual Therapy   Treatment Goals addressed: Coping for Adjustment / Anxiety / Autism   Interventions: CBT, DBT, Solution Focused, Strength-based and Supportive   Summary: Jennifer Higgins is a 28 y.o. female who presents with Adjustment Disorder with mixed  mood and Anxiety, [name] social anxiety (Autism),. The OPT therapist worked with the patient for her OPT treatment session. The OPT therapist utilized Motivational Interviewing to assist in creating therapeutic repore. The patient in the session was engaged and work in collaboration giving feedback about her triggers and symptoms over the past few weeks. The patient spoke about hospital evaluation for inpatient post the patient verbalizing S/I , however, the patient was evaluated and not admitted to hospital at that time (06/26/23) .The patient spoke about her trigger at the time which was a break up with a boy she had been talking online with for the past 6 months. The patient spoke about her other current social interactions via online gaming with friends, and noted she is currently still talking to her ex-boyfriend hoping that she will be getting back together with him, however, this could lead the patient back into crisis if he does not take her back and the OPT therapist overviewed concern over this with the patient including reviewing supports and self safety should she go back into crisis  including additional inpatient evaluation.  The OPT therapist overviewed upcoming appointments as listed in the patients MyChart.    Suicidal/Homicidal: Nowithout intent/plan   Therapist Response:The OPT therapist worked with the patient for the patients scheduled session. The patient was engaged in her session and gave feedback in relation to triggers, symptoms, and behavior responses over the past few weeks. The patient identified improvement and reduction in conflict with the other family members (cousins) which was creating tension/stress for the patient and her roommate/cousin.. The OPT therapist worked with the patient utilizing an in session Cognitive Behavioral Therapy exercise. The patient was responsive in the session and verbalized, " I was going through a lot with my ex-boyfriend and I was frustrated and I think I was saying I dont want to live anymore and that led to me being taken to the hospital ".  The patient continues to talk with the boyfriend with the hope of getting back together, however, this poses a real threat and risk to the patient going back into crisis if the ex-boyfriend refuses to take the patient back.. The OPT therapist worked with the patient overviewing basic health areas including sleep cycle, eating habits, hygiene, and physical exercise. The patient in this session verbalized no current S/I or H/I.  The patient spoke about understanding the importance of if things do not work out not allowing this to put her back into crisis and utilizing her supports..The OPT therapist additionally noted with the patient if she goes into crisis and feels she might be at risk to harm herself or anyone else she should be seen at  the local ED for evaluation for inpatient hospitalization.The OPT therapist will continue treatment work with the patient in her next scheduled session.   Plan: Return again in 2/3 weeks.   Diagnosis:      Axis I:Social Anxiety, [name] Adjustment Disorder with mixed  mood and anxiety (ASD)                           Axis II: No diagnosis       Collaboration of Care: No additional collaboration for this session.   Patient/Guardian was advised Release of Information must be obtained prior to any record release in order to collaborate their care with an outside provider. Patient/Guardian was advised if they have not already done so to contact the registration department to sign all necessary forms in order for [place] to release information regarding their care.    Consent: Patient/Guardian gives verbal consent for treatment and assignment of benefits for services provided during this visit. Patient/Guardian expressed understanding and agreed to proceed.    I discussed the assessment and treatment plan with the patient. The patient was provided an opportunity to ask questions and all were answered. The patient agreed with the plan and demonstrated an understanding of the instructions.   The patient was advised to call back or seek an in-person evaluation if the symptoms worsen or if the condition fails to improve as anticipated.   I provided 38 minutes of face-to-face time during this encounter.   Suzan Garibaldi, LCSW   07/25/2023

## 2023-08-11 ENCOUNTER — Encounter (HOSPITAL_COMMUNITY): Payer: Self-pay

## 2023-08-14 ENCOUNTER — Telehealth (HOSPITAL_COMMUNITY): Payer: 59 | Admitting: Psychiatry

## 2023-08-14 ENCOUNTER — Encounter (HOSPITAL_COMMUNITY): Payer: Self-pay | Admitting: Psychiatry

## 2023-08-14 DIAGNOSIS — Z7289 Other problems related to lifestyle: Secondary | ICD-10-CM

## 2023-08-14 DIAGNOSIS — F332 Major depressive disorder, recurrent severe without psychotic features: Secondary | ICD-10-CM | POA: Diagnosis not present

## 2023-08-14 DIAGNOSIS — Z79899 Other long term (current) drug therapy: Secondary | ICD-10-CM

## 2023-08-14 DIAGNOSIS — F913 Oppositional defiant disorder: Secondary | ICD-10-CM

## 2023-08-14 DIAGNOSIS — E559 Vitamin D deficiency, unspecified: Secondary | ICD-10-CM

## 2023-08-14 DIAGNOSIS — F4310 Post-traumatic stress disorder, unspecified: Secondary | ICD-10-CM

## 2023-08-14 DIAGNOSIS — F840 Autistic disorder: Secondary | ICD-10-CM | POA: Diagnosis not present

## 2023-08-14 DIAGNOSIS — F4010 Social phobia, unspecified: Secondary | ICD-10-CM

## 2023-08-14 DIAGNOSIS — R634 Abnormal weight loss: Secondary | ICD-10-CM

## 2023-08-14 DIAGNOSIS — H9325 Central auditory processing disorder: Secondary | ICD-10-CM

## 2023-08-14 NOTE — Patient Instructions (Signed)
 We did not make any medication changes today in favor of presenting to the hospital for further stabilization and observation.

## 2023-08-14 NOTE — Progress Notes (Signed)
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 the patient's social worker/guardian, recommendation for higher level of care.  Elsie Lincoln, MD 08/14/2023, 11:56 AM

## 2023-08-20 ENCOUNTER — Telehealth: Payer: Self-pay

## 2023-08-22 ENCOUNTER — Ambulatory Visit (INDEPENDENT_AMBULATORY_CARE_PROVIDER_SITE_OTHER): Payer: 59 | Admitting: Clinical

## 2023-08-22 DIAGNOSIS — F4310 Post-traumatic stress disorder, unspecified: Secondary | ICD-10-CM

## 2023-08-22 DIAGNOSIS — F4010 Social phobia, unspecified: Secondary | ICD-10-CM

## 2023-08-22 DIAGNOSIS — F4323 Adjustment disorder with mixed anxiety and depressed mood: Secondary | ICD-10-CM | POA: Diagnosis not present

## 2023-08-22 DIAGNOSIS — F840 Autistic disorder: Secondary | ICD-10-CM | POA: Diagnosis not present
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
## 2023-08-22 NOTE — Telephone Encounter (Signed)
error 

## 2023-08-23 ENCOUNTER — Ambulatory Visit (HOSPITAL_COMMUNITY)
Admission: EM | Admit: 2023-08-23 | Discharge: 2023-08-23 | Disposition: A | Discharge status: Home / Self Care | Attending: Behavioral Health | Admitting: Behavioral Health

## 2023-08-23 DIAGNOSIS — F4320 Adjustment disorder, unspecified: Secondary | ICD-10-CM | POA: Diagnosis present

## 2023-08-23 NOTE — Discharge Instructions (Addendum)
 Discharge recommendations:   Medications: Patient is to take medications as prescribed. No medication changes were made during your stay. The patient or patient's guardian is to contact a medical professional and/or outpatient provider to address any new side effects that develop. The patient or the patient's guardian should update outpatient providers of any new medications and/or medication changes.   Outpatient Follow up: [facility] at [facility]  for medication management and counseling to address mental health concerns.   Therapy: We recommend that patient participate in individual therapy to address mental health concerns.  Atypical antipsychotics: If you are prescribed an atypical antipsychotic, it is recommended that your height, weight, BMI, blood pressure, fasting lipid panel, and fasting blood sugar be monitored by your outpatient providers.  Safety:   The following safety precautions should be taken:   No sharp objects. This includes scissors, razors, scrapers, and putty knives.   Chemicals should be removed and locked up.   Medications should be removed and locked up.   Weapons should be removed and locked up. This includes firearms, knives and instruments that can be used to cause injury.   The patient should abstain from use of illicit substances/drugs and abuse of any medications.  If symptoms worsen or do not continue to improve or if the patient becomes actively suicidal or homicidal then it is recommended that the patient return to the closest hospital emergency department, the [facility], or call 911 for further evaluation and treatment. National Suicide Prevention Lifeline 1-800-SUICIDE or 219 022 6409.  About 988 988 offers 24/7 access to trained crisis counselors who can help people experiencing mental health-related distress. People can call or text 988 or chat 988lifeline.org for themselves or if they  are worried about a loved one who may need crisis support.
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
## 2023-08-23 NOTE — Discharge Summary (Signed)
 Jennifer Higgins to be D/C'd Home per NP order. An After Visit Summary was printed and given to the patient by provider. Patient escorted out and D/C home via private auto.  Dickie La  08/23/2023 12:27 PM
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
## 2023-08-28 ENCOUNTER — Encounter (HOSPITAL_COMMUNITY): Payer: Self-pay | Admitting: Psychiatry

## 2023-08-28 ENCOUNTER — Telehealth (INDEPENDENT_AMBULATORY_CARE_PROVIDER_SITE_OTHER): Admitting: Psychiatry

## 2023-08-28 DIAGNOSIS — F332 Major depressive disorder, recurrent severe without psychotic features: Secondary | ICD-10-CM | POA: Diagnosis not present

## 2023-08-28 DIAGNOSIS — F913 Oppositional defiant disorder: Secondary | ICD-10-CM

## 2023-08-28 DIAGNOSIS — F840 Autistic disorder: Secondary | ICD-10-CM

## 2023-08-28 DIAGNOSIS — E559 Vitamin D deficiency, unspecified: Secondary | ICD-10-CM

## 2023-08-28 DIAGNOSIS — Z7289 Other problems related to lifestyle: Secondary | ICD-10-CM

## 2023-08-28 DIAGNOSIS — F4310 Post-traumatic stress disorder, unspecified: Secondary | ICD-10-CM | POA: Diagnosis not present

## 2023-08-28 DIAGNOSIS — H9325 Central auditory processing disorder: Secondary | ICD-10-CM

## 2023-08-28 DIAGNOSIS — F603 Borderline personality disorder: Secondary | ICD-10-CM

## 2023-08-28 DIAGNOSIS — Z79899 Other long term (current) drug therapy: Secondary | ICD-10-CM

## 2023-08-28 DIAGNOSIS — R634 Abnormal weight loss: Secondary | ICD-10-CM

## 2023-08-28 MED ORDER — ESCITALOPRAM OXALATE 20 MG PO TABS: 20.0000 mg | ORAL_TABLET | Freq: Every day | ORAL | 2 refills | Status: DC

## 2023-08-28 NOTE — Progress Notes (Signed)
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 evaluation if the symptoms worsen or if the condition fails to improve as anticipated.  I provided 50 minutes of virtual face-to-face time during this encounter including gathering collateral from patient's cousin who has received permission to make medication adjustments and discuss case by the patient's social worker/guardian, recommendation for higher level of care.  Elsie Lincoln, MD 08/28/2023, 3:01 PM

## 2023-08-28 NOTE — Patient Instructions (Signed)
 We increased lexapro to 20mg  once daily today. Ethyl would likely benefit from intensive therapy services and you may want to consider in home services to address the dynamics there. ABA therapy can also be a big help.

## 2023-10-03 ENCOUNTER — Ambulatory Visit (INDEPENDENT_AMBULATORY_CARE_PROVIDER_SITE_OTHER): Admitting: Clinical

## 2023-10-03 DIAGNOSIS — F4010 Social phobia, unspecified: Secondary | ICD-10-CM | POA: Diagnosis not present

## 2023-10-03 DIAGNOSIS — F840 Autistic disorder: Secondary | ICD-10-CM

## 2023-10-03 DIAGNOSIS — F4323 Adjustment disorder with mixed anxiety and depressed mood: Secondary | ICD-10-CM | POA: Diagnosis not present

## 2023-10-03 DIAGNOSIS — F4310 Post-traumatic stress disorder, unspecified: Secondary | ICD-10-CM

## 2023-10-03 NOTE — Progress Notes (Addendum)
 IN PERSON    I connected with Jennifer Higgins on 10/03/23 at  1:00 PM ESTin person and verified that I am speaking with the correct person using two identifiers.   Location: Patient: office Provider: office    I discussed the limitations of evaluation and management by telemedicine and the availability of in person appointments. The patient expressed understanding and agreed to proceed. ( IN PERSON)       THERAPY PROGRESS NOTE   Session Time: 1:00 PM- 1:45 PM   Participation Level: Active   Behavioral Response: CasualAlert/Anxious   Type of Therapy: Individual Therapy   Treatment Goals addressed: Coping for Adjustment / Anxiety / Autism   Interventions: CBT, DBT, Solution Focused, Strength-based and Supportive   Summary: Jennifer Higgins is a 28 y.o. female who presents with Adjustment Disorder with mixed  mood and Anxiety, [name] social anxiety (Autism),. The OPT therapist worked with the patient for her OPT treatment session. The OPT therapist utilized Motivational Interviewing to assist in creating therapeutic repore. The patient in the session was engaged and work in collaboration giving feedback about her triggers and symptoms over the past few weeks. The patient spoke about improvement from her last session and stabilizing and feeling more willing to move on from a break up with her boyfriend. The patient presented in a positive mood and was involved and more interactive/ involved in session today. The OPT therapist gauged the patients harm risk and the patient indicated no current S/I. The patient spoke about looking forward to attending some upcoming social events to help with socialization with other people. The OPT therapist overviewed the patients med therapy transition plan with her current psychiatrist Dr. Cathyann Cobia soon leaving the practice.    Suicidal/Homicidal: Nowithout intent/plan   Therapist Response:The OPT therapist worked with the patient for the patients scheduled  session. The patient was engaged in her session and gave feedback in relation to triggers, symptoms, and behavior responses over the past few weeks. The patient presented for the session in a positive mood and not tearful. The OPT therapist worked with the patient utilizing an in session Cognitive Behavioral Therapy exercise. The patient was responsive in the session and verbalized, " I am in a better place with the relationship break up and I still need time but I am thinking more about moving on". The patient continues to communicate with the ex-boyfriend but it has been at a titrated and diminished capacity.The patient spoke about looking forward to upcoming events, cookouts, and her season pass for the Summer at [facility]. The OPT therapist worked with the patient encouraging change of environment, stabilization of sleep pattern, consistency with medication, and socialization. The patient and caregiver both validated the patient has been in a better mood and more active over the past few weeks.   Plan: Return again in 2/3 weeks.   Diagnosis:      Axis I:Social Anxiety, [name] Adjustment Disorder with mixed mood and anxiety (ASD)                           Axis II: No diagnosis       Collaboration of Care: No additional collaboration for this session.   Patient/Guardian was advised Release of Information must be obtained prior to any record release in order to collaborate their care with an outside provider. Patient/Guardian was advised if they have not already done so to contact the registration department to sign all necessary  forms in order for us  to release information regarding their care.    Consent: Patient/Guardian gives verbal consent for treatment and assignment of benefits for services provided during this visit. Patient/Guardian expressed understanding and agreed to proceed.    I discussed the assessment and treatment plan with the patient. The patient was provided an  opportunity to ask questions and all were answered. The patient agreed with the plan and demonstrated an understanding of the instructions.   The patient was advised to call back or seek an in-person evaluation if the symptoms worsen or if the condition fails to improve as anticipated.   I provided 45 minutes of face-to-face time during this encounter.   Secundino Dach, LCSW   10/03/2023

## 2023-10-11 ENCOUNTER — Encounter (INDEPENDENT_AMBULATORY_CARE_PROVIDER_SITE_OTHER): Payer: Self-pay | Admitting: Family Medicine

## 2023-10-11 DIAGNOSIS — F913 Oppositional defiant disorder: Secondary | ICD-10-CM | POA: Diagnosis not present

## 2023-10-11 DIAGNOSIS — F840 Autistic disorder: Secondary | ICD-10-CM

## 2023-10-11 NOTE — Telephone Encounter (Signed)
 Hello, Jyl Or is out of the office. I have placed the referral for you.   Tommas Fragmin, FNP   Approximately 5 minutes was spent documenting and reviewing patient's chart.

## 2023-10-14 ENCOUNTER — Other Ambulatory Visit (HOSPITAL_COMMUNITY): Payer: Self-pay | Admitting: Psychiatry

## 2023-10-14 DIAGNOSIS — F332 Major depressive disorder, recurrent severe without psychotic features: Secondary | ICD-10-CM

## 2023-10-14 DIAGNOSIS — R45851 Suicidal ideations: Secondary | ICD-10-CM

## 2023-10-14 DIAGNOSIS — F840 Autistic disorder: Secondary | ICD-10-CM

## 2023-10-17 ENCOUNTER — Ambulatory Visit (INDEPENDENT_AMBULATORY_CARE_PROVIDER_SITE_OTHER): Admitting: Clinical

## 2023-10-17 DIAGNOSIS — F4010 Social phobia, unspecified: Secondary | ICD-10-CM

## 2023-10-17 DIAGNOSIS — F4310 Post-traumatic stress disorder, unspecified: Secondary | ICD-10-CM | POA: Diagnosis not present

## 2023-10-17 DIAGNOSIS — F4323 Adjustment disorder with mixed anxiety and depressed mood: Secondary | ICD-10-CM | POA: Diagnosis not present

## 2023-10-17 NOTE — Progress Notes (Signed)
 IN PERSON    I connected with Jennifer Higgins on 10/03/23 at  1:00 PM ESTin person and verified that I am speaking with the correct person using two identifiers.   Location: Patient: office Provider: office    I discussed the limitations of evaluation and management by telemedicine and the availability of in person appointments. The patient expressed understanding and agreed to proceed. ( IN PERSON)       THERAPY PROGRESS NOTE   Session Time: 1:00 PM- 1:45 PM   Participation Level: Active   Behavioral Response: CasualAlert/Anxious   Type of Therapy: Individual Therapy   Treatment Goals addressed: Coping for Adjustment / Anxiety / Autism   Interventions: CBT, DBT, Solution Focused, Strength-based and Supportive   Summary: Jennifer Higgins is a 28 y.o. female who presents with Adjustment Disorder with mixed  mood and Anxiety, [name] social anxiety (Autism),. The OPT therapist worked with the patient for her OPT treatment session. The OPT therapist utilized Motivational Interviewing to assist in creating therapeutic repore. The patient in the session was engaged and work in collaboration giving feedback about her triggers and symptoms over the past few weeks. The patient spoke about ongoing improvement stabilizing from move on from a break up with her boyfriend and recently networking and making new connections in her online social circle. The patient presented in a positive mood and was involved and more interactive/ involved in session today. The OPT therapist gauged the patients harm risk and the patient indicated no current S/I. The patient spoke about looking forward to attending some upcoming social events including going to [facility] over the Summer as the family has season passes. Additionally the patient has been involved with Autism Program and meetings/social events. The OPT therapist overviewed the patients med therapy transition plan with her current psychiatrist Dr.  Cathyann Cobia having left the practice and the patient/caregiver is currently working on transitioning to a [facility] provider for psychiatry out of [place], [place].    Suicidal/Homicidal: Nowithout intent/plan   Therapist Response:The OPT therapist worked with the patient for the patients scheduled session. The patient was engaged in her session and gave feedback in relation to triggers, symptoms, and behavior responses over the past few weeks. The patient presented for the session in a positive mood and not tearful. The OPT therapist worked with the patient utilizing an in session Cognitive Behavioral Therapy exercise. The patient was responsive in the session and verbalized, " I think things have been going good I have met some new people I am talking to". The patient continues to communicate with the ex-boyfriend but it has been at a friend capacity and the patient verbalized this no longer bothers her the same way it use to.The patient spoke about looking forward to upcoming events, cookouts, and her season pass for the Summer at [facility]. The OPT therapist worked with the patient encouraging change of environment, stabilization of sleep pattern, consistency with medication, and socialization. The patient and caregiver both validated the patient has been in a better mood and more active over the past few weeks. The patient will be adding program support through [facility] to assist with ADLs.   Plan: Return again in 2/3 weeks.   Diagnosis:      Axis I:Social Anxiety, [name] Adjustment Disorder with mixed mood and anxiety (ASD)                           Axis II:  No diagnosis       Collaboration of Care: No additional collaboration for this session.   Patient/Guardian was advised Release of Information must be obtained prior to any record release in order to collaborate their care with an outside provider. Patient/Guardian was advised if they have not already done so to contact the registration  department to sign all necessary forms in order for us  to release information regarding their care.    Consent: Patient/Guardian gives verbal consent for treatment and assignment of benefits for services provided during this visit. Patient/Guardian expressed understanding and agreed to proceed.    I discussed the assessment and treatment plan with the patient. The patient was provided an opportunity to ask questions and all were answered. The patient agreed with the plan and demonstrated an understanding of the instructions.   The patient was advised to call back or seek an in-person evaluation if the symptoms worsen or if the condition fails to improve as anticipated.   I provided 45 minutes of face-to-face time during this encounter.   Secundino Dach, LCSW   10/17/2023

## 2023-10-18 ENCOUNTER — Other Ambulatory Visit: Payer: Self-pay | Admitting: Family

## 2023-10-18 NOTE — Addendum Note (Signed)
 Addended by: Tommas Fragmin A on: 10/18/2023 02:41 PM   Modules accepted: Orders

## 2023-10-30 ENCOUNTER — Encounter: Payer: Self-pay | Admitting: Family Medicine

## 2023-10-31 ENCOUNTER — Encounter: Payer: Self-pay | Admitting: Neurology

## 2023-11-01 MED ORDER — PROMETHAZINE HCL 12.5 MG PO TABS: 12.5000 mg | ORAL_TABLET | Freq: Three times a day (TID) | ORAL | 5 refills | Status: AC | PRN

## 2023-11-05 ENCOUNTER — Ambulatory Visit (HOSPITAL_COMMUNITY): Attending: Family | Admitting: Occupational Therapy

## 2023-11-05 DIAGNOSIS — F88 Other disorders of psychological development: Secondary | ICD-10-CM | POA: Insufficient documentation

## 2023-11-05 DIAGNOSIS — F840 Autistic disorder: Secondary | ICD-10-CM | POA: Insufficient documentation

## 2023-11-05 DIAGNOSIS — R29898 Other symptoms and signs involving the musculoskeletal system: Secondary | ICD-10-CM | POA: Insufficient documentation

## 2023-11-05 DIAGNOSIS — Z789 Other specified health status: Secondary | ICD-10-CM | POA: Diagnosis present

## 2023-11-05 DIAGNOSIS — F913 Oppositional defiant disorder: Secondary | ICD-10-CM | POA: Insufficient documentation

## 2023-11-06 ENCOUNTER — Encounter (HOSPITAL_COMMUNITY): Payer: Self-pay | Admitting: Occupational Therapy

## 2023-11-06 ENCOUNTER — Other Ambulatory Visit: Payer: Self-pay
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
## 2023-11-08 ENCOUNTER — Ambulatory Visit (INDEPENDENT_AMBULATORY_CARE_PROVIDER_SITE_OTHER): Admitting: Clinical

## 2023-11-08 DIAGNOSIS — F4323 Adjustment disorder with mixed anxiety and depressed mood: Secondary | ICD-10-CM

## 2023-11-08 DIAGNOSIS — F4310 Post-traumatic stress disorder, unspecified: Secondary | ICD-10-CM

## 2023-11-08 DIAGNOSIS — F4010 Social phobia, unspecified: Secondary | ICD-10-CM | POA: Diagnosis not present
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
## 2023-11-15 ENCOUNTER — Encounter (HOSPITAL_COMMUNITY): Payer: Self-pay | Admitting: Occupational Therapy

## 2023-11-15 ENCOUNTER — Ambulatory Visit (HOSPITAL_COMMUNITY): Admitting: Occupational Therapy

## 2023-11-15 DIAGNOSIS — F840 Autistic disorder: Secondary | ICD-10-CM

## 2023-11-15 DIAGNOSIS — F88 Other disorders of psychological development: Secondary | ICD-10-CM

## 2023-11-15 DIAGNOSIS — Z789 Other specified health status: Secondary | ICD-10-CM
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
## 2023-11-15 NOTE — Patient Instructions (Signed)
 Daily Schedule -Wake up -Take Morning Medication -Eat Breakfast if Hungry -Pick Out Clothes -Get Dressed -Deodorant -Comb Hair -Brush Teeth -Play VR -Eat Lunch -Water  Plants -Fill Birdfeeder -Check Mail -Sweep -Unload Dishwasher -Clean Bedroom -Video Games with Friends -Eat Dinner -Snack -Patent examiner

## 2023-11-20 ENCOUNTER — Encounter: Payer: Self-pay | Admitting: Gastroenterology

## 2023-11-21 ENCOUNTER — Ambulatory Visit (INDEPENDENT_AMBULATORY_CARE_PROVIDER_SITE_OTHER): Admitting: Clinical

## 2023-11-21 DIAGNOSIS — F4010 Social phobia, unspecified: Secondary | ICD-10-CM

## 2023-11-21 DIAGNOSIS — F4310 Post-traumatic stress disorder, unspecified: Secondary | ICD-10-CM | POA: Diagnosis not present

## 2023-11-21 DIAGNOSIS — F840 Autistic disorder: Secondary | ICD-10-CM | POA: Diagnosis not present

## 2023-11-21 DIAGNOSIS — F4323 Adjustment disorder with mixed anxiety and depressed mood: Secondary | ICD-10-CM | POA: Diagnosis not present

## 2023-11-21 NOTE — Progress Notes (Signed)
 IN PERSON    I connected with Jennifer Higgins on 11/21/23 at  1:00 PM ESTin person and verified that I am speaking with the correct person using two identifiers.   Location: Patient: office Provider: office    I discussed the limitations of evaluation and management by telemedicine and the availability of in person appointments. The patient expressed understanding and agreed to proceed. ( IN PERSON)       THERAPY PROGRESS NOTE   Session Time: 1:00 PM- 1:45 PM   Participation Level: Active   Behavioral Response: CasualAlert/Anxious   Type of Therapy: Individual Therapy   Treatment Goals addressed: Coping for Adjustment / Anxiety / Autism   Interventions: CBT, DBT, Solution Focused, Strength-based and Supportive   Summary: [name] is a 28 y.o. female who presents with Adjustment Disorder with mixed  mood and Anxiety, [name] social anxiety (Autism),. The OPT therapist worked with the patient for her OPT treatment session.The OPT therapist utilized Motivational Interviewing to assist in creating ongoing therapeutic repore. The patient presented with much more energy and was noticeably move verbal giving insight and feedback about her triggers and symptoms over the past few weeks. The patient spoke about being interested in 2/3 new people she has been having conversations with online that are part of her online gaming community from different parts of the world. The patient spoke about going to [facility] with the family has season passes. Additionally the patient has been involved with Autism Program and meetings/social events. The OPT therapist overviewed the patients med therapy transition plan with her current psychiatrist Dr. Barbra having left the practice.The patients guardian indicated the patient has a schedule visit with a psychiatrist who will be managing her psych meds moving forward. The patient spoke about her ongoing involvement with Vocational  Rehabilitation and her desire to work to make money so that she can pay for the internet and not feel controlled wanting her independence around her electronics use which is currently being managed by her guardian.   Suicidal/Homicidal: Nowithout intent/plan   Therapist Response:The OPT therapist worked with the patient for the patients scheduled session. The patient was engaged in her session and gave feedback in relation to triggers, symptoms, and behavior responses over the past few weeks. The patient presented for the session in a positive mood and not tearful. The OPT therapist worked with the patient utilizing an in session Cognitive Behavioral Therapy exercise. The patient was responsive in the session and verbalized,  Things overall have been good I just want to not feel like my caregiver is controlling me and I would rather work and pay for the internet instead of having my access cut off at 11PM. The patient spoke about looking forward to upcoming events, cookouts, and her season pass for the Summer at [facility] and 4th of July holiday. The OPT therapist worked with the patient encouraging, consistency with medication, as well as communication and compromise with her guardian. The patient will has been involved in OT program to help with support  to assist with ADLs and has completed 2 of the 12 covered sessions. The patient in this session spoke about her realization of the need for her own accountability and work towards ADL's in the home.   Plan: Return again in 2/3 weeks.   Diagnosis:      Axis I:Social Anxiety, [name] Adjustment Disorder with mixed mood and anxiety (ASD)  Axis II: No diagnosis       Collaboration of Care: No additional collaboration for this session.   Patient/Guardian was advised Release of Information must be obtained prior to any record release in order to collaborate their care with an outside provider. Patient/Guardian was  advised if they have not already done so to contact the registration department to sign all necessary forms in order for us  to release information regarding their care.    Consent: Patient/Guardian gives verbal consent for treatment and assignment of benefits for services provided during this visit. Patient/Guardian expressed understanding and agreed to proceed.    I discussed the assessment and treatment plan with the patient. The patient was provided an opportunity to ask questions and all were answered. The patient agreed with the plan and demonstrated an understanding of the instructions.   The patient was advised to call back or seek an in-person evaluation if the symptoms worsen or if the condition fails to improve as anticipated.   I provided 45 minutes of face-to-face time during this encounter.   Jerel Pepper, LCSW   11/21/2023

## 2023-11-22 ENCOUNTER — Encounter (HOSPITAL_COMMUNITY): Payer: Self-pay | Admitting: Occupational Therapy

## 2023-11-22 ENCOUNTER — Ambulatory Visit (HOSPITAL_COMMUNITY): Admitting: Occupational Therapy

## 2023-11-22 DIAGNOSIS — Z789 Other specified health status: Secondary | ICD-10-CM

## 2023-11-22 DIAGNOSIS — F840 Autistic disorder: Secondary | ICD-10-CM

## 2023-11-22 DIAGNOSIS — F88 Other disorders of psychological development: Secondary | ICD-10-CM
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
## 2023-11-29 ENCOUNTER — Encounter (HOSPITAL_COMMUNITY): Payer: Self-pay | Admitting: Occupational Therapy

## 2023-11-29 ENCOUNTER — Ambulatory Visit (HOSPITAL_COMMUNITY): Attending: Family | Admitting: Occupational Therapy

## 2023-11-29 DIAGNOSIS — Z789 Other specified health status: Secondary | ICD-10-CM | POA: Insufficient documentation

## 2023-11-29 DIAGNOSIS — F840 Autistic disorder: Secondary | ICD-10-CM | POA: Diagnosis present

## 2023-11-29 DIAGNOSIS — F88 Other disorders of psychological development: Secondary | ICD-10-CM | POA: Diagnosis present
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
## 2023-12-06 ENCOUNTER — Ambulatory Visit (HOSPITAL_COMMUNITY): Admitting: Occupational Therapy

## 2023-12-06 ENCOUNTER — Encounter (HOSPITAL_COMMUNITY): Payer: Self-pay | Admitting: Occupational Therapy

## 2023-12-06 DIAGNOSIS — F88 Other disorders of psychological development: Secondary | ICD-10-CM

## 2023-12-06 DIAGNOSIS — Z789 Other specified health status: Secondary | ICD-10-CM

## 2023-12-06 DIAGNOSIS — F840 Autistic disorder: Secondary | ICD-10-CM
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
## 2023-12-13 ENCOUNTER — Ambulatory Visit (INDEPENDENT_AMBULATORY_CARE_PROVIDER_SITE_OTHER): Admitting: Clinical

## 2023-12-13 ENCOUNTER — Encounter (HOSPITAL_COMMUNITY): Payer: Self-pay | Admitting: Occupational Therapy

## 2023-12-13 ENCOUNTER — Ambulatory Visit (HOSPITAL_COMMUNITY): Admitting: Occupational Therapy

## 2023-12-13 DIAGNOSIS — F4010 Social phobia, unspecified: Secondary | ICD-10-CM

## 2023-12-13 DIAGNOSIS — F4323 Adjustment disorder with mixed anxiety and depressed mood: Secondary | ICD-10-CM | POA: Diagnosis not present

## 2023-12-13 DIAGNOSIS — F88 Other disorders of psychological development: Secondary | ICD-10-CM

## 2023-12-13 DIAGNOSIS — F4310 Post-traumatic stress disorder, unspecified: Secondary | ICD-10-CM | POA: Diagnosis not present

## 2023-12-13 DIAGNOSIS — Z789 Other specified health status: Secondary | ICD-10-CM

## 2023-12-13 DIAGNOSIS — F840 Autistic disorder: Secondary | ICD-10-CM
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
## 2023-12-13 NOTE — Patient Instructions (Signed)
 Daily Tasks  Sunday Monday Tuesday Wednesday Thursday Friday Saturday   Wake up         [employer] dressed         Deodorant         Comb/dry hair         Brush Teeth         AM Meds         Water  Plants         Trash         Feed Birds         Sweep         Clean bedroom         Check mail         Unload dishwasher         PM Meds           Weekly Tasks Change Sheets   Laundry   Clean Bathroom

## 2023-12-13 NOTE — Progress Notes (Signed)
 IN PERSON    I connected with Jennifer Higgins on 12/13/23 at  1:00 PM ESTin person and verified that I am speaking with the correct person using two identifiers.   Location: Patient: office Provider: office    I discussed the limitations of evaluation and management by telemedicine and the availability of in person appointments. The patient expressed understanding and agreed to proceed. ( IN PERSON)       THERAPY PROGRESS NOTE   Session Time: 1:00 PM- 1:45 PM   Participation Level: Active   Behavioral Response: CasualAlert/Anxious   Type of Therapy: Individual Therapy   Treatment Goals addressed: Coping for Adjustment / Anxiety / Autism   Interventions: CBT, DBT, Solution Focused, Strength-based and Supportive   Summary: Jennifer Higgins is a 28 y.o. female who presents with Adjustment Disorder with mixed  mood and Anxiety, [name] social anxiety (Autism),. The OPT therapist worked with the patient for her OPT treatment session.The OPT therapist utilized Motivational Interviewing to assist in creating ongoing therapeutic repore. The patient presented with much more energy and was noticeably move verbal giving insight and feedback about her triggers and symptoms over the past few weeks. The patient spoke about currently having a boyfriend from [place] that she connects with virtually through a online gaming platform..The patient spoke about  involvement in OT and working on ADLs. . The patient spoke about wanting her independence around her electronics use which is currently being managed by her guardian and utilizing a at home choir chart design from OT to gain more independence at home.   Suicidal/Homicidal: Nowithout intent/plan   Therapist Response:The OPT therapist worked with the patient for the patients scheduled session. The patient was engaged in her session and gave feedback in relation to triggers, symptoms, and behavior responses over the past few weeks. The patient presented  for the session in a positive mood and not tearful. The OPT therapist worked with the patient utilizing an in session Cognitive Behavioral Therapy exercise. The patient was responsive in the session and verbalized,  Things overall have been better I have been completing task at home from my chore chart that my OT provider helped to create so that I can gain more independence at home. The patient spoke about enjoying video gaming, watching anime, and utilizing a created avatar to interact with her boyfriend from [place] through a virtual dating/gaming site.. The OPT therapist worked with the patient encouraging, consistency with medication, as well as communication and compromise with her guardian. The patient will has been involved in OT program to help with support  to assist with ADLs. The OPT therapist overviewed with the patient her listed upcoming appointments from her MyChart.    Plan: Return again in 2/3 weeks.   Diagnosis:      Axis I:Social Anxiety, [name] Adjustment Disorder with mixed mood and anxiety (ASD)                           Axis II: No diagnosis       Collaboration of Care: No additional collaboration for this session.   Patient/Guardian was advised Release of Information must be obtained prior to any record release in order to collaborate their care with an outside provider. Patient/Guardian was advised if they have not already done so to contact the registration department to sign all necessary forms in order for us  to release information regarding their care.    Consent: Patient/Guardian gives verbal consent for  treatment and assignment of benefits for services provided during this visit. Patient/Guardian expressed understanding and agreed to proceed.    I discussed the assessment and treatment plan with the patient. The patient was provided an opportunity to ask questions and all were answered. The patient agreed with the plan and demonstrated an understanding of the  instructions.   The patient was advised to call back or seek an in-person evaluation if the symptoms worsen or if the condition fails to improve as anticipated.   I provided 45 minutes of face-to-face time during this encounter.   Jerel Pepper, LCSW   12/13/2023

## 2023-12-20 ENCOUNTER — Ambulatory Visit (HOSPITAL_COMMUNITY): Admitting: Occupational Therapy

## 2023-12-20 ENCOUNTER — Encounter (HOSPITAL_COMMUNITY): Payer: Self-pay | Admitting: Occupational Therapy

## 2023-12-20 DIAGNOSIS — F88 Other disorders of psychological development: Secondary | ICD-10-CM

## 2023-12-20 DIAGNOSIS — F840 Autistic disorder: Secondary | ICD-10-CM

## 2023-12-20 DIAGNOSIS — Z789 Other specified health status: Secondary | ICD-10-CM
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
## 2023-12-20 NOTE — Patient Instructions (Signed)
 Daily Tasks   Sunday Monday Tuesday Wednesday Thursday Friday Saturday   Wake up                Shower (spray shower daily)                Get dressed                Deodorant                Comb/dry hair                Brush Teeth                AM Meds                Water  Plants                Trash                Feed Birds                Sweep                Clean bedroom                Check mail                Unload dishwasher                PM Meds                 Weekly Tasks Wash sheets and replace on bed    Laundry    Clean bathroom     Take trash to the road                     List of Expenses:  Rent Phone Utilities-internet/streaming, lights/electricity, water  Food Pet care costs Clothing Toiletries [facility]  Dining out  [place] Period Expectations: -All tasks are completed daily or weekly  -On Saturdays, only daily self-care tasks, not chores. Self-care tasks are color coded in blue  Payment for Chores: $14 per week Payment for Weekly Tasks: $13 per week  Bi-weekly payment (paid every 2 weeks)

## 2023-12-27 ENCOUNTER — Encounter (HOSPITAL_COMMUNITY): Payer: Self-pay | Admitting: Occupational Therapy

## 2023-12-27 ENCOUNTER — Ambulatory Visit (HOSPITAL_COMMUNITY): Admitting: Occupational Therapy

## 2023-12-27 ENCOUNTER — Ambulatory Visit (HOSPITAL_COMMUNITY): Admitting: Clinical

## 2023-12-27 DIAGNOSIS — F88 Other disorders of psychological development: Secondary | ICD-10-CM | POA: Diagnosis not present

## 2023-12-27 DIAGNOSIS — F840 Autistic disorder: Secondary | ICD-10-CM

## 2023-12-27 DIAGNOSIS — Z789 Other specified health status: Secondary | ICD-10-CM
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
## 2023-12-27 NOTE — Patient Instructions (Signed)
 Daily Tasks    Sunday Monday Tuesday Wednesday Thursday Friday Saturday   Wake up                Shower  (spray shower daily)                Get dressed                Deodorant                Comb/dry hair                Brush Teeth                AM Meds                Water  Plants                Check Trash (Take out if full)                Feed Birds                Sweep                Clean bedroom                Check mail                Unload dishwasher                PM Meds                   Weekly Tasks  Wash sheets and replace on bed    Laundry & clean out lint trap    Clean bathroom    Take trash to the road     Shave at least 1x/week                        Things to try this week:   -Small fan in the bathroom with door shut  -Try a litter box in the bathroom for [place]  -Look at [employer] bus website:  [email or web address]  -Look at [employer] website: Home : [facility]  -Find Neurosurgeon

## 2024-01-03 ENCOUNTER — Encounter (HOSPITAL_COMMUNITY): Admitting: Occupational Therapy

## 2024-01-04 ENCOUNTER — Ambulatory Visit (HOSPITAL_COMMUNITY): Admitting: Clinical

## 2024-01-04 DIAGNOSIS — F4323 Adjustment disorder with mixed anxiety and depressed mood: Secondary | ICD-10-CM

## 2024-01-04 DIAGNOSIS — F4310 Post-traumatic stress disorder, unspecified: Secondary | ICD-10-CM

## 2024-01-04 DIAGNOSIS — F4010 Social phobia, unspecified: Secondary | ICD-10-CM | POA: Diagnosis not present

## 2024-01-04 DIAGNOSIS — F840 Autistic disorder: Secondary | ICD-10-CM | POA: Diagnosis not present

## 2024-01-04 NOTE — Progress Notes (Signed)
 Virtual Visit via Video Note  I connected with Jennifer Higgins on 01/04/24 at  9:00 AM EDT by a video enabled telemedicine application and verified that I am speaking with the correct person using two identifiers.  Location: Patient: home Provider: office   I discussed the limitations of evaluation and management by telemedicine and the availability of in person appointments. The patient expressed understanding and agreed to proceed.  THERAPY PROGRESS NOTE   Session Time: 9:00 AM- 9:30 AM   Participation Level: Active   Behavioral Response: CasualAlert/Anxious   Type of Therapy: Individual Therapy   Treatment Goals addressed: Coping for Adjustment / Anxiety / Autism   Interventions: CBT, DBT, Solution Focused, Strength-based and Supportive   Summary: Jennifer Higgins is a 28 y.o. female who presents with Adjustment Disorder with mixed  mood and Anxiety, [name] social anxiety (Autism),. The OPT therapist worked with the patient for her OPT treatment session.The OPT therapist utilized Motivational Interviewing to assist in creating ongoing therapeutic repore. The patient gave insight and feedback about her triggers and symptoms over the past few weeks. The patient spoke about the biggest change since her last session being that her family member she lives with was granted legal guardianship over her this being a shift from the guardianship previously held by her caseworker [name]. The patient spoke about interactions with her boyfriend from [place] that she connects with virtually through a online gaming platform and her desire to save money to get a x-box so she can connect and game with him online.The patient spoke about  involvement in OT and working on ADLs. . The patient spoke about wanting her independence and her feelings about having a guardian. The patient spoke about wanting her independence  around her electronics use which is currently being managed by her guardian and utilizing a  at home choir chart design from OT to gain more independence at home.   Suicidal/Homicidal: Nowithout intent/plan   Therapist Response:The OPT therapist worked with the patient for the patients scheduled session. The patient was engaged in her session and gave feedback in relation to triggers, symptoms, and behavior responses over the past few weeks. The patient spoke about the biggest change since her last session being that her family member she lives with was granted legal guardianship over her this being a shift from the guardianship previously held by her caseworker [name]. The patient spoke about interactions with her boyfriend from [place] that she connects with virtually through a online gaming platform and her desire to save money to get a x-box so she can connect and game with him online The OPT therapist worked with the patient utilizing an in session Cognitive Behavioral Therapy exercise. The patient was responsive in the session and verbalized,  I dont want to have a guardian I want to be my own guardian and have my own independence, I want to hopefully get a passport and eventually move in and live with my boyfriend from [place]. The patient spoke about enjoying video gaming, watching anime, and utilizing a created avatar to interact with her boyfriend from [place] through a gaming site.The OPT therapist worked with the patient encouraging, consistency with medication, use of coping strategy as well as communication and compromise with her guardian. The patient will has been involved in OT program to help with support  to assist with ADLs. The OPT therapist overviewed with the patient her listed upcoming appointments from her MyChart.    Plan: Return again in 2/3 weeks.  Diagnosis:      Axis I:Social Anxiety, [name] Adjustment Disorder with mixed mood and anxiety (ASD)                           Axis II: No diagnosis       Collaboration of Care: No additional collaboration for this  session.   Patient/Guardian was advised Release of Information must be obtained prior to any record release in order to collaborate their care with an outside provider. Patient/Guardian was advised if they have not already done so to contact the registration department to sign all necessary forms in order for us  to release information regarding their care.    Consent: Patient/Guardian gives verbal consent for treatment and assignment of benefits for services provided during this visit. Patient/Guardian expressed understanding and agreed to proceed.    I discussed the assessment and treatment plan with the patient. The patient was provided an opportunity to ask questions and all were answered. The patient agreed with the plan and demonstrated an understanding of the instructions.   The patient was advised to call back or seek an in-person evaluation if the symptoms worsen or if the condition fails to improve as anticipated.   I provided 30 minutes of non-face-to-face time during this encounter.   Jerel Pepper, LCSW   01/04/2024

## 2024-01-10 ENCOUNTER — Encounter (HOSPITAL_COMMUNITY): Payer: Self-pay | Admitting: Occupational Therapy

## 2024-01-10 ENCOUNTER — Ambulatory Visit (HOSPITAL_COMMUNITY): Attending: Family | Admitting: Occupational Therapy

## 2024-01-10 DIAGNOSIS — F840 Autistic disorder: Secondary | ICD-10-CM | POA: Insufficient documentation

## 2024-01-10 DIAGNOSIS — Z789 Other specified health status: Secondary | ICD-10-CM | POA: Insufficient documentation

## 2024-01-10 DIAGNOSIS — F88 Other disorders of psychological development: Secondary | ICD-10-CM | POA: Diagnosis present

## 2024-01-10 NOTE — Patient Instructions (Signed)
 Daily Tasks     Sunday Monday Tuesday Wednesday Thursday Friday Saturday   Wake up                Shower (spray shower daily)                Get dressed                Deodorant                Comb/dry hair                Brush and Floss Teeth                Check mail         AM Meds                Water Plants                Check Trash (Take out if full)                Feed Birds                Sweep                Clean bedroom                Unload dishwasher                PM Meds                    Weekly Tasks   Wash sheets and replace on bed    Laundry & clean out lint trap    Clean shower     Take trash to the road     Shave at least 1x/week     Help clean out refrigerator                         Things to try this week:    -Small fan in the bathroom with door shut   -Try a litter box in the bathroom for [place]   -Look at [employer] bus website:  [email or web address]   -Look at [employer] website: Home : [facility]   -Find Tour manager Breakdown Take dishes upstairs Pick stuff up and put away where belong Put dirty clothes in clothes hamper Hang up clean clothes or put away Put away electronics Put blankets away (or wash if needed) Remove other items from bed and wash sheets Sweep or vacuum floor
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
## 2024-01-11 ENCOUNTER — Ambulatory Visit (INDEPENDENT_AMBULATORY_CARE_PROVIDER_SITE_OTHER): Payer: Medicare Other | Admitting: Family Medicine

## 2024-01-11 ENCOUNTER — Encounter: Payer: Self-pay | Admitting: Family Medicine

## 2024-01-11 VITALS — BP 109/72 | HR 106 | Temp 98.3°F | Ht 65.0 in | Wt 162.6 lb

## 2024-01-11 DIAGNOSIS — E782 Mixed hyperlipidemia: Secondary | ICD-10-CM

## 2024-01-11 DIAGNOSIS — Z0184 Encounter for antibody response examination: Secondary | ICD-10-CM

## 2024-01-11 DIAGNOSIS — E559 Vitamin D deficiency, unspecified: Secondary | ICD-10-CM | POA: Diagnosis not present

## 2024-01-11 DIAGNOSIS — Z1159 Encounter for screening for other viral diseases: Secondary | ICD-10-CM

## 2024-01-11 DIAGNOSIS — F603 Borderline personality disorder: Secondary | ICD-10-CM

## 2024-01-11 DIAGNOSIS — Z130 Encounter for screening for diseases of the blood and blood-forming organs and certain disorders involving the immune mechanism: Secondary | ICD-10-CM

## 2024-01-11 DIAGNOSIS — F840 Autistic disorder: Secondary | ICD-10-CM

## 2024-01-11 DIAGNOSIS — F913 Oppositional defiant disorder: Secondary | ICD-10-CM

## 2024-01-11 DIAGNOSIS — Z0000 Encounter for general adult medical examination without abnormal findings: Secondary | ICD-10-CM

## 2024-01-11 DIAGNOSIS — Z0001 Encounter for general adult medical examination with abnormal findings: Secondary | ICD-10-CM | POA: Diagnosis not present
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
## 2024-01-11 NOTE — Patient Instructions (Signed)

## 2024-01-13 LAB — CBC WITH DIFFERENTIAL/PLATELET
Basophils Absolute: 0.1 x10E3/uL (ref 0.0–0.2)
Basos: 1 %
EOS (ABSOLUTE): 0.3 x10E3/uL (ref 0.0–0.4)
Eos: 4 %
Hematocrit: 47.6 % — ABNORMAL HIGH (ref 34.0–46.6)
Hemoglobin: 15.1 g/dL (ref 11.1–15.9)
Immature Grans (Abs): 0 x10E3/uL (ref 0.0–0.1)
Immature Granulocytes: 0 %
Lymphocytes Absolute: 2.1 x10E3/uL (ref 0.7–3.1)
Lymphs: 26 %
MCH: 29.3 pg (ref 26.6–33.0)
MCHC: 31.7 g/dL (ref 31.5–35.7)
MCV: 92 fL (ref 79–97)
Monocytes Absolute: 0.6 x10E3/uL (ref 0.1–0.9)
Monocytes: 8 %
Neutrophils Absolute: 4.9 x10E3/uL (ref 1.4–7.0)
Neutrophils: 61 %
Platelets: 299 x10E3/uL (ref 150–450)
RBC: 5.15 x10E6/uL (ref 3.77–5.28)
RDW: 12.8 % (ref 11.7–15.4)
WBC: 8 x10E3/uL (ref 3.4–10.8)

## 2024-01-13 LAB — CMP14+EGFR
ALT: 13 IU/L (ref 0–32)
AST: 19 IU/L (ref 0–40)
Albumin: 4.8 g/dL (ref 4.0–5.0)
Alkaline Phosphatase: 76 IU/L (ref 44–121)
BUN/Creatinine Ratio: 6 — ABNORMAL LOW (ref 9–23)
BUN: 6 mg/dL (ref 6–20)
Bilirubin Total: 0.6 mg/dL (ref 0.0–1.2)
CO2: 22 mmol/L (ref 20–29)
Calcium: 9.6 mg/dL (ref 8.7–10.2)
Chloride: 103 mmol/L (ref 96–106)
Creatinine, Ser: 0.96 mg/dL (ref 0.57–1.00)
Globulin, Total: 2.3 g/dL (ref 1.5–4.5)
Glucose: 96 mg/dL (ref 70–99)
Potassium: 4.1 mmol/L (ref 3.5–5.2)
Sodium: 142 mmol/L (ref 134–144)
Total Protein: 7.1 g/dL (ref 6.0–8.5)
eGFR: 84 mL/min/1.73 (ref >59)

## 2024-01-13 LAB — VITAMIN D 25 HYDROXY (VIT D DEFICIENCY, FRACTURES): Vit D, 25-Hydroxy: 33.9 ng/mL (ref 30.0–100.0)

## 2024-01-13 LAB — LIPID PANEL
Chol/HDL Ratio: 3.4 ratio (ref 0.0–4.4)
Cholesterol, Total: 196 mg/dL (ref 100–199)
HDL: 57 mg/dL (ref >39)
LDL Chol Calc (NIH): 118 mg/dL — ABNORMAL HIGH (ref 0–99)
Triglycerides: 119 mg/dL (ref 0–149)
VLDL Cholesterol Cal: 21 mg/dL (ref 5–40)

## 2024-01-13 LAB — HEPATITIS B SURFACE ANTIBODY, QUANTITATIVE: Hepatitis B Surf Ab Quant: <3.5 m[IU]/mL — ABNORMAL LOW

## 2024-01-13 LAB — HEPATITIS C ANTIBODY: Hep C Virus Ab: NONREACTIVE

## 2024-01-13 LAB — TSH: TSH: 1.07 u[IU]/mL (ref 0.450–4.500)

## 2024-01-14 ENCOUNTER — Ambulatory Visit: Payer: Self-pay | Admitting: Family Medicine

## 2024-01-17 ENCOUNTER — Encounter (HOSPITAL_COMMUNITY): Admitting: Occupational Therapy

## 2024-01-24 ENCOUNTER — Ambulatory Visit (HOSPITAL_COMMUNITY): Admitting: Occupational Therapy

## 2024-01-24 ENCOUNTER — Encounter (HOSPITAL_COMMUNITY): Payer: Self-pay | Admitting: Occupational Therapy

## 2024-01-24 DIAGNOSIS — F88 Other disorders of psychological development: Secondary | ICD-10-CM | POA: Diagnosis not present

## 2024-01-24 DIAGNOSIS — F840 Autistic disorder: Secondary | ICD-10-CM

## 2024-01-24 DIAGNOSIS — Z789 Other specified health status: Secondary | ICD-10-CM
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
## 2024-01-24 NOTE — Patient Instructions (Signed)
 Daily Tasks     Sunday Monday Tuesday Wednesday Thursday Friday Saturday   Wake up                Shower (spray shower daily)                Get dressed                Deodorant                Comb/dry hair                Brush and Floss Teeth                Clean litter box         Check mail                AM Meds                Water  Plants                Check Trash (Take out if full)                Feed Birds                Sweep                Clean bedroom                Unload dishwasher                PM Meds                    Weekly Tasks   Wash sheets and replace on bed    Laundry & clean out lint trap    Fold and put clean laundry away   Clean shower     Take trash to the road     Bring trash cans back to the house    Shave at least 1x/week     Help clean out refrigerator        Cleaning Bedroom Breakdown  Take dishes upstairs   Fold clean laundry and put it in correct spot   Put dirty clothes in clothes hamper   Pick stuff up and put away where belongs   Put blankets away or wash if needed   Remove bed sheets and wash   Put away electronics   Sweep or vacuum floor   Take all trash and put in outside trash can          *Bedroom checks on Sundays.  *Food & dishes stay in kitchen or dining room. If dishes go missing and have to buy more, person responsible for missing dishes purchases new.  *Another option=purchase paper plates and utensils that are disposable.

## 2024-01-25 ENCOUNTER — Ambulatory Visit (INDEPENDENT_AMBULATORY_CARE_PROVIDER_SITE_OTHER): Payer: Medicare Other

## 2024-01-25 VITALS — BP 109/72 | HR 106 | Ht 65.0 in | Wt 162.0 lb

## 2024-01-25 DIAGNOSIS — Z0000 Encounter for general adult medical examination without abnormal findings: Secondary | ICD-10-CM | POA: Diagnosis not present
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
## 2024-01-25 NOTE — Patient Instructions (Addendum)
 Ms. Jennifer Higgins , Thank you for taking time out of your busy schedule to complete your Annual Wellness Visit with me. I enjoyed our conversation and look forward to speaking with you again next year. I, as well as your care team,  appreciate your ongoing commitment to your health goals. Please review the following plan we discussed and let me know if I can assist you in the future. Your Game plan/ To Do List    Referrals: If you haven't heard from the office you've been referred to, please reach out to them at the phone provided.   Follow up Visits: We will see or speak with you next year for your Next Medicare AWV with our clinical staff on 12/27/24 at 11:20a.m. Have you seen your provider in the last 6 months (3 months if uncontrolled diabetes)? Yes  Clinician Recommendations:  Aim for 30 minutes of exercise or brisk walking, 6-8 glasses of water , and 5 servings of fruits and vegetables each day.       This is a list of the screenings recommended for you:  Health Maintenance  Topic Date Due   Hepatitis B Vaccine (1 of 3 - 19+ 3-dose series) Never done   Medicare Annual Wellness Visit  01/24/2024   Flu Shot  08/26/2024*   HPV Vaccine (1 - 3-dose series) 01/10/2025*   HIV Screening  01/10/2025*   COVID-19 Vaccine (1) 01/26/2025*   Pap Smear  06/30/2025   DTaP/Tdap/Td vaccine (2 - Tdap) 07/30/2029   Hepatitis C Screening  Completed   Pneumococcal Vaccine  Aged Out   Meningitis B Vaccine  Aged Out  *Topic was postponed. The date shown is not the original due date.    Advanced directives: (Declined) Advance directive discussed with you today. Even though you declined this today, please call our office should you change your mind, and we can give you the proper paperwork for you to fill out. Advance Care Planning is important because it:  [x]  Makes sure you receive the medical care that is consistent with your values, goals, and preferences  [x]  It provides guidance to your family and loved  ones and reduces their decisional burden about whether or not they are making the right decisions based on your wishes.  Follow the link provided in your after visit summary or read over the paperwork we have mailed to you to help you started getting your Advance Directives in place. If you need assistance in completing these, please reach out to us  so that we can help you!  See attachments for Preventive Care and Fall Prevention Tips.

## 2024-01-30 ENCOUNTER — Ambulatory Visit (HOSPITAL_COMMUNITY): Admitting: Clinical

## 2024-01-30 DIAGNOSIS — F840 Autistic disorder: Secondary | ICD-10-CM | POA: Diagnosis not present

## 2024-01-30 DIAGNOSIS — F4310 Post-traumatic stress disorder, unspecified: Secondary | ICD-10-CM | POA: Diagnosis not present

## 2024-01-30 DIAGNOSIS — F4323 Adjustment disorder with mixed anxiety and depressed mood: Secondary | ICD-10-CM | POA: Diagnosis not present

## 2024-01-30 DIAGNOSIS — F4010 Social phobia, unspecified: Secondary | ICD-10-CM

## 2024-01-30 NOTE — Telephone Encounter (Signed)
 Reviewed results with patients guardian and guardian voiced understanding.

## 2024-01-30 NOTE — Progress Notes (Signed)
 Virtual Visit via Video Note   I connected with [name] on 01/30/24 at  3:00 PM EDT by a video enabled telemedicine application and verified that I am speaking with the correct person using two identifiers.   Location: Patient: home Provider: office   I discussed the limitations of evaluation and management by telemedicine and the availability of in person appointments. The patient expressed understanding and agreed to proceed.   THERAPY PROGRESS NOTE   Session Time: 3:00 PM- 3:35 PM   Participation Level: Active   Behavioral Response: CasualAlert/Anxious   Type of Therapy: Individual Therapy   Treatment Goals addressed: Coping for Adjustment / Anxiety / Autism   Interventions: CBT, DBT, Solution Focused, Strength-based and Supportive   Summary: Jennifer Higgins is a 28 y.o. female who presents with Adjustment Disorder with mixed  mood and Anxiety, [name] social anxiety (Autism),. The OPT therapist worked with the patient for her OPT treatment session.The OPT therapist utilized Motivational Interviewing to assist in creating ongoing therapeutic repore. The patient gave insight and feedback about her triggers and symptoms over the past few weeks. The patient spoke about the her acceptance and the adjustment of a family member she lives with was granted legal guardianship over her this being a shift from the guardianship previously held by her caseworker Jennifer Higgins. The patient spoke about interactions with her boyfriend from Jennifer Higgins that she connects with virtually through a online gaming platform and her desire to save money to get a x-box so she can connect and game with him online.The patient spoke about  involvement in OT and working on ADLs. . The patient spoke about wanting her independence and her feelings about having a guardian. The patient spoke about wanting to further develop her independent living skills and she is currently utilizing a at home choir chart design from OT to  gain more independence at home.The Opt therapist overviewed with the patient focus on her basic needs areas. The patient spoke about her interactions with her family member and their roommate.   Suicidal/Homicidal: Nowithout intent/plan   Therapist Response:The OPT therapist worked with the patient for the patients scheduled session. The patient was engaged in her session and gave feedback in relation to triggers, symptoms, and behavior responses over the past few weeks. The patient spoke about the adjustment to the shift of her family member she lives with being granted legal guardianship over her this being a shift from the guardianship previously held by her caseworker Jennifer Higgins. The patient spoke about interactions with her boyfriend from Jennifer Higgins that she connects with virtually through a online gaming platform and her desire to save money to get a x-box so she can connect and game with him online The OPT therapist worked with the patient utilizing an in session Cognitive Behavioral Therapy exercise. The patient was responsive in the session and verbalized,  I want to continue to get better at independence living skills and figuring out public transportation and continue work on my goal of being more independent. The patient spoke about enjoying video gaming, watching anime, and utilizing a created avatar to interact with her boyfriend from Jennifer Higgins through a gaming site.The OPT therapist worked with the patient encouraging, consistency with medication, use of coping strategy as well as communication and compromise with her guardian. The patient will has been involved in OT program to help with support to assist with ADLs. The patient spoke about not having any large changes or conflicts since her last session and being compliant  in taking her prescribed medication. The OPT therapist overviewed with the patient her listed upcoming appointments from her MyChart.    Plan: Return again in 2/3 weeks.    Diagnosis:      Axis I:Social Anxiety, [name] Adjustment Disorder with mixed mood and anxiety (ASD)                           Axis II: No diagnosis       Collaboration of Care: No additional collaboration for this session.   Patient/Guardian was advised Release of Information must be obtained prior to any record release in order to collaborate their care with an outside provider. Patient/Guardian was advised if they have not already done so to contact the registration department to sign all necessary forms in order for us  to release information regarding their care.    Consent: Patient/Guardian gives verbal consent for treatment and assignment of benefits for services provided during this visit. Patient/Guardian expressed understanding and agreed to proceed.    I discussed the assessment and treatment plan with the patient. The patient was provided an opportunity to ask questions and all were answered. The patient agreed with the plan and demonstrated an understanding of the instructions.   The patient was advised to call back or seek an in-person evaluation if the symptoms worsen or if the condition fails to improve as anticipated.   I provided 35 minutes of non-face-to-face time during this encounter.   Jerel Pepper, LCSW   01/30/2024

## 2024-01-31 ENCOUNTER — Encounter (HOSPITAL_COMMUNITY): Payer: Self-pay | Admitting: Occupational Therapy

## 2024-01-31 ENCOUNTER — Ambulatory Visit (HOSPITAL_COMMUNITY): Attending: Family | Admitting: Occupational Therapy

## 2024-01-31 DIAGNOSIS — F88 Other disorders of psychological development: Secondary | ICD-10-CM | POA: Insufficient documentation

## 2024-01-31 DIAGNOSIS — F840 Autistic disorder: Secondary | ICD-10-CM | POA: Diagnosis present

## 2024-01-31 DIAGNOSIS — Z789 Other specified health status: Secondary | ICD-10-CM | POA: Insufficient documentation
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
## 2024-02-07 ENCOUNTER — Ambulatory Visit (HOSPITAL_COMMUNITY): Admitting: Occupational Therapy

## 2024-02-07 ENCOUNTER — Encounter (HOSPITAL_COMMUNITY): Payer: Self-pay | Admitting: Occupational Therapy

## 2024-02-07 DIAGNOSIS — Z789 Other specified health status: Secondary | ICD-10-CM

## 2024-02-07 DIAGNOSIS — F840 Autistic disorder: Secondary | ICD-10-CM

## 2024-02-07 DIAGNOSIS — F88 Other disorders of psychological development: Secondary | ICD-10-CM | POA: Diagnosis not present
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
## 2024-02-21 ENCOUNTER — Encounter (HOSPITAL_COMMUNITY): Payer: Self-pay | Admitting: Occupational Therapy

## 2024-02-21 ENCOUNTER — Ambulatory Visit (HOSPITAL_COMMUNITY): Admitting: Occupational Therapy

## 2024-02-21 DIAGNOSIS — F88 Other disorders of psychological development: Secondary | ICD-10-CM | POA: Diagnosis not present

## 2024-02-21 DIAGNOSIS — F840 Autistic disorder: Secondary | ICD-10-CM

## 2024-02-21 DIAGNOSIS — Z789 Other specified health status: Secondary | ICD-10-CM
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
## 2024-03-13 ENCOUNTER — Ambulatory Visit (HOSPITAL_COMMUNITY): Admitting: Clinical

## 2024-03-13 DIAGNOSIS — F4323 Adjustment disorder with mixed anxiety and depressed mood: Secondary | ICD-10-CM | POA: Diagnosis not present

## 2024-03-13 DIAGNOSIS — F418 Other specified anxiety disorders: Secondary | ICD-10-CM

## 2024-03-13 DIAGNOSIS — F4310 Post-traumatic stress disorder, unspecified: Secondary | ICD-10-CM

## 2024-03-13 DIAGNOSIS — F4010 Social phobia, unspecified: Secondary | ICD-10-CM

## 2024-03-13 DIAGNOSIS — F840 Autistic disorder: Secondary | ICD-10-CM

## 2024-03-13 NOTE — Progress Notes (Signed)
 IN PERSON   I connected with Jennifer Higgins on 03/13/24 at  1:00 PM EDT in person and verified that I am speaking with the correct person using two identifiers.   Location: Patient: office Provider: office   I discussed the limitations of evaluation and management by telemedicine and the availability of in person appointments. The patient expressed understanding and agreed to proceed. ( IN PERSON)    THERAPY PROGRESS NOTE   Session Time: 1:00 PM- 1:30 PM   Participation Level: Active   Behavioral Response: CasualAlert/Anxious   Type of Therapy: Individual Therapy   Treatment Goals addressed: Coping for Adjustment / Anxiety / Autism   Interventions: CBT, DBT, Solution Focused, Strength-based and Supportive   Summary: Jennifer Higgins is a 28 y.o. female who presents with Adjustment Disorder with mixed  mood and Anxiety, [name] social anxiety (Autism),. The OPT therapist worked with the patient for her OPT treatment session.The OPT therapist utilized Motivational Interviewing to assist in creating ongoing therapeutic repore. The patient gave insight and feedback about her triggers and symptoms over the past few weeks. The patient spoke about a break up with her boyfriend from [place] that she connects with virtually through a online gaming platform. and the negative impact this has had on the patients compliance with ADLs., however, this happened at the beganing of October and the patient has not gone into a state of crisis and seemingly has been handling the breakup well by not cutting herself off from others and not completely stopping her selfcare or adherence to her own basic needs . The patient spoke about  still wanting her independence and her feelings about having a guardian. The patient spoke about wanting to further develop her independent living skills and she is currently utilizing a at home choir chart design from OT to gain more independence at home.The Opt therapist overviewed  with the patient focus on her basic needs areas. The patient spoke about her interactions with others and her agreement to continue to take care of her self hygiene and  not stop her social interactions with friends and supports.   Suicidal/Homicidal: Nowithout intent/plan   Therapist Response:The OPT therapist worked with the patient for the patients scheduled session. The patient was engaged in her session and gave feedback in relation to triggers, symptoms, and behavior responses over the past few weeks. The patient spoke about a break up with her boyfriend from [place] that she connects with virtually through a online gaming platform around the beginning of October and the impact of the break up over the past few weeks. The OPT therapist worked with the patient utilizing an in session Cognitive Behavioral Therapy exercise. The patient was responsive in the session and verbalized,  I am still trying to reach out and stay connected to other friends. The patient spoke about enjoying video gaming, watching anime, and utilizing a created avatar in her online gaming. The OPT therapist worked with the patient encouraging, consistency with medication, use of coping strategy as well as communication and compromise with her guardian. The patient will has been involved in OT program to help with support to assist with ADLs. The patient spoke willingness to continue to give attention to her basic needs areas despite being upset about the recent break up and still hanging on to the hope she might get back together with her ex-boyfriend. The patient spoke about ongoing compliance in taking her prescribed medication. The OPT therapist overviewed with the patient her listed upcoming appointments  from her MyChart.    Plan: Return again in 2/3 weeks.   Diagnosis:      Axis I:Social Anxiety, [name] Adjustment Disorder with mixed mood and anxiety (ASD)                           Axis II: No diagnosis        Collaboration of Care: No additional collaboration for this session.   Patient/Guardian was advised Release of Information must be obtained prior to any record release in order to collaborate their care with an outside provider. Patient/Guardian was advised if they have not already done so to contact the registration department to sign all necessary forms in order for us  to release information regarding their care.    Consent: Patient/Guardian gives verbal consent for treatment and assignment of benefits for services provided during this visit. Patient/Guardian expressed understanding and agreed to proceed.    I discussed the assessment and treatment plan with the patient. The patient was provided an opportunity to ask questions and all were answered. The patient agreed with the plan and demonstrated an understanding of the instructions.   The patient was advised to call back or seek an in-person evaluation if the symptoms worsen or if the condition fails to improve as anticipated.   I provided 30 minutes of face-to-face time during this encounter.   Jerel Pepper, LCSW   03/13/2024

## 2024-03-18 ENCOUNTER — Ambulatory Visit: Payer: 59 | Admitting: Neurology

## 2024-04-10 ENCOUNTER — Encounter: Payer: Self-pay | Admitting: Family Medicine

## 2024-04-10 ENCOUNTER — Ambulatory Visit (INDEPENDENT_AMBULATORY_CARE_PROVIDER_SITE_OTHER): Admitting: Family Medicine

## 2024-04-10 ENCOUNTER — Ambulatory Visit (INDEPENDENT_AMBULATORY_CARE_PROVIDER_SITE_OTHER): Admitting: Clinical

## 2024-04-10 VITALS — BP 119/81 | HR 106 | Temp 98.1°F | Ht 65.0 in | Wt 168.8 lb

## 2024-04-10 DIAGNOSIS — F4323 Adjustment disorder with mixed anxiety and depressed mood: Secondary | ICD-10-CM | POA: Diagnosis not present

## 2024-04-10 DIAGNOSIS — F4010 Social phobia, unspecified: Secondary | ICD-10-CM | POA: Diagnosis not present

## 2024-04-10 DIAGNOSIS — F4310 Post-traumatic stress disorder, unspecified: Secondary | ICD-10-CM | POA: Diagnosis not present

## 2024-04-10 DIAGNOSIS — F840 Autistic disorder: Secondary | ICD-10-CM | POA: Diagnosis not present

## 2024-04-10 DIAGNOSIS — H6123 Impacted cerumen, bilateral: Secondary | ICD-10-CM

## 2024-04-10 NOTE — Progress Notes (Signed)
   Acute Office Visit  Subjective:     Patient ID: Jennifer Higgins, female    DOB: 07-Dec-1995, 28 y.o.   MRN: [id number]  Chief Complaint  Patient presents with   Cerumen Impaction    HPI  History of Present Illness   [name] is a 28 year old female who presents with hearing difficulties due to earwax buildup. She is accompanied by a family member who provided additional information about her hearing difficulties.  Hearing impairment - Hearing difficulties present for approximately one month - Sensation of loudness in both ears - Family member observed decreased hearing, resulting in speaking softly and playing music loudly  Cerumen impaction - History of earwax buildup requiring ear flushing procedures on at least two occasions  Otalgia - Occasional pain in one ear, particularly associated with 'silent burping' - Pain attributed to pressure changes - No fever or drainage       ROS As per HPI.      Objective:    BP 119/81   Pulse (!) 106   Temp 98.1 F (36.7 C) (Temporal)   Ht 5' 5 (1.651 m)   Wt 168 lb 12.8 oz (76.6 kg)   SpO2 99%   BMI 28.09 kg/m    Physical Exam Vitals and nursing note reviewed.  Constitutional:      General: She is not in acute distress.    Appearance: She is not ill-appearing.  HENT:     Right Ear: External ear normal. There is impacted cerumen.     Left Ear: External ear normal. There is impacted cerumen.  Pulmonary:     Effort: Pulmonary effort is normal. No respiratory distress.  Neurological:     Mental Status: She is alert and oriented to person, place, and time. Mental status is at baseline.  Psychiatric:        Mood and Affect: Mood normal.        Behavior: Behavior normal.     Ear Cerumen Removal  Date/Time: 04/10/2024 2:23 PM  Performed by: Joesph Annabella HERO, FNP Authorized by: Joesph Annabella HERO, FNP   Anesthesia: Local Anesthetic: none Location details: right ear and left ear Patient tolerance:  patient tolerated the procedure well with no immediate complications Comments: Normal TMs visualized bilaterally post irrigation. Procedure type: irrigation  Sedation: Patient sedated: no      No results found for any visits on 04/10/24.      Assessment & Plan:   [name] was seen today for cerumen impaction.  Diagnoses and all orders for this visit:  Bilateral impacted cerumen -     Ear Cerumen Removal  Assessment and Plan    Impacted cerumen, bilateral Bilateral impacted cerumen caused significant hearing impairment. Irrigation today in office. No cerumen present post-flush. - Use Debrox drops twice weekly as needed to soften wax. - Return if symptoms recur or Debrox is ineffective.     Return to office for new or worsening symptoms, or if symptoms persist.   The patient indicates understanding of these issues and agrees with the plan.  Annabella HERO Joesph, FNP

## 2024-04-10 NOTE — Progress Notes (Signed)
 IN PERSON   I connected with Jennifer Higgins on 04/08/24 at  11:00 AM EDT in person and verified that I am speaking with the correct person using two identifiers.   Location: Patient: office Provider: office   I discussed the limitations of evaluation and management by telemedicine and the availability of in person appointments. The patient expressed understanding and agreed to proceed. ( IN PERSON)    THERAPY PROGRESS NOTE   Session Time: 11:00 AM- 11:30 AM   Participation Level: Active   Behavioral Response: CasualAlert/Anxious   Type of Therapy: Individual Therapy   Treatment Goals addressed: Coping for Adjustment / Anxiety / Autism   Interventions: CBT, DBT, Solution Focused, Strength-based and Supportive   Summary: Jennifer Higgins is a 28 y.o. female who presents with Adjustment Disorder with mixed  mood and Anxiety, [name] social anxiety (Autism),. The OPT therapist worked with the patient for her OPT treatment session.The OPT therapist utilized Motivational Interviewing to assist in creating ongoing therapeutic repore. The patient gave insight and feedback about her triggers and symptoms over the past few weeks. The patient spoke about recovering well from prior break up with her boyfriend from [place] that she connected with virtually through a online gaming platform by not cutting herself off from others and not  stopping her selfcare or adherence to her own basic needs . The patient spoke about  still wanting her independence and her feelings about having a guardian. The patient spoke about wanting to further develop her independent living skills and continues to work on this at home.The Opt therapist overviewed with the patient focus on her basic needs areas. The patient spoke about her interactions with others and her agreement to continue to take care of her self hygiene and  not stop her social interactions with friends and supports.  The patient spoke about looking forward to  upcoming Thanksgiving holiday and celebrating with family going to local [employer] for family gathering and food.The OPT therapist overviewed with the patient her upcoming appointments as listed in her Mychart.   Suicidal/Homicidal: Nowithout intent/plan   Therapist Response:The OPT therapist worked with the patient for the patients scheduled session. The patient was engaged in her session and gave feedback in relation to triggers, symptoms, and behavior responses over the past few weeks. The patient spoke about recovery post break up with her boyfriend from [place] that she connected with virtually through a online gaming platform  The OPT therapist worked with the patient utilizing an in session Cognitive Behavioral Therapy exercise. The patient was responsive in the session and verbalized,  I have been keeping myself busy and staying connected to other friends I talk to and that has really been helping me. The patient spoke about enjoying video gaming, watching anime, and music being coping outlets. The OPT therapist worked with the patient encouraging, consistency with medication, use of coping strategy as well as communication and compromise with her guardian. The patient will has been involved in OT program to help with support to assist with ADLs. The patient spoke willingness to continue to give attention to her basic needs areas. The patient spoke about ongoing compliance in taking her prescribed medication. The OPT therapist overviewed with the patient her listed upcoming appointments from her MyChart.    Plan: Return again in 2/3 weeks.   Diagnosis:      Axis I:Social Anxiety, [name] Adjustment Disorder with mixed mood and anxiety (ASD)  Axis II: No diagnosis       Collaboration of Care:Overview of patient involvement in the med therapy program and upcoming appointment with [facility] for psychiatry   Patient/Guardian was advised Release of  Information must be obtained prior to any record release in order to collaborate their care with an outside provider. Patient/Guardian was advised if they have not already done so to contact the registration department to sign all necessary forms in order for us  to release information regarding their care.    Consent: Patient/Guardian gives verbal consent for treatment and assignment of benefits for services provided during this visit. Patient/Guardian expressed understanding and agreed to proceed.    I discussed the assessment and treatment plan with the patient. The patient was provided an opportunity to ask questions and all were answered. The patient agreed with the plan and demonstrated an understanding of the instructions.   The patient was advised to call back or seek an in-person evaluation if the symptoms worsen or if the condition fails to improve as anticipated.   I provided 30 minutes of face-to-face time during this encounter.   Jerel Pepper, LCSW   04/10/2024

## 2024-04-21 ENCOUNTER — Ambulatory Visit (HOSPITAL_COMMUNITY): Admitting: Registered Nurse

## 2024-04-21 ENCOUNTER — Encounter: Payer: Self-pay | Admitting: Neurology

## 2024-04-21 ENCOUNTER — Encounter (HOSPITAL_COMMUNITY): Payer: Self-pay | Admitting: Registered Nurse

## 2024-04-21 ENCOUNTER — Other Ambulatory Visit: Payer: Self-pay

## 2024-04-21 VITALS — BP 108/72 | HR 89 | Ht 65.0 in | Wt 171.6 lb

## 2024-04-21 DIAGNOSIS — F909 Attention-deficit hyperactivity disorder, unspecified type: Secondary | ICD-10-CM | POA: Diagnosis not present

## 2024-04-21 DIAGNOSIS — F330 Major depressive disorder, recurrent, mild: Secondary | ICD-10-CM

## 2024-04-21 DIAGNOSIS — F840 Autistic disorder: Secondary | ICD-10-CM

## 2024-04-21 DIAGNOSIS — F4310 Post-traumatic stress disorder, unspecified: Secondary | ICD-10-CM

## 2024-04-21 MED ORDER — CLONIDINE HCL 0.1 MG PO TABS: 0.1000 mg | ORAL_TABLET | Freq: Every day | ORAL | 1 refills | Status: AC

## 2024-04-21 MED ORDER — LISDEXAMFETAMINE DIMESYLATE 30 MG PO CAPS: 30.0000 mg | ORAL_CAPSULE | Freq: Every day | ORAL | 0 refills | Status: AC

## 2024-04-21 MED ORDER — ARIPIPRAZOLE 5 MG PO TABS
5.0000 mg | ORAL_TABLET | Freq: Every evening | ORAL | 1 refills | Status: AC
Start: 2024-04-21 — End: ?

## 2024-04-21 MED ORDER — ESCITALOPRAM OXALATE 20 MG PO TABS: 20.0000 mg | ORAL_TABLET | Freq: Every day | ORAL | 1 refills | Status: AC
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
## 2024-04-21 NOTE — Patient Instructions (Signed)

## 2024-04-25 ENCOUNTER — Other Ambulatory Visit: Payer: Self-pay | Admitting: Neurology

## 2024-04-29 ENCOUNTER — Encounter: Admitting: Family Medicine

## 2024-04-29 DIAGNOSIS — F840 Autistic disorder: Secondary | ICD-10-CM

## 2024-04-30 NOTE — Telephone Encounter (Signed)
 Diagnoses and all orders for this visit:  Autism spectrum disorder   Letter of certification has been uploaded for guardian.   Please see the MyChart message reply(ies) for my assessment and plan.    This patient gave consent for this Medical Advice Message and is aware that it may result in a bill to [employer], as well as the possibility of receiving a bill for a co-payment or deductible. They are an established patient, but are not seeking medical advice exclusively about a problem treated during an in person or video visit in the last seven days. I did not recommend an in person or video visit within seven days of my reply.    I spent a total of 5 minutes cumulative time within 7 days through [employer].  Annabella CHRISTELLA Search, FNP

## 2024-05-08 ENCOUNTER — Ambulatory Visit (INDEPENDENT_AMBULATORY_CARE_PROVIDER_SITE_OTHER): Admitting: Clinical

## 2024-05-08 DIAGNOSIS — F4310 Post-traumatic stress disorder, unspecified: Secondary | ICD-10-CM | POA: Diagnosis not present

## 2024-05-08 DIAGNOSIS — F4010 Social phobia, unspecified: Secondary | ICD-10-CM

## 2024-05-08 DIAGNOSIS — F840 Autistic disorder: Secondary | ICD-10-CM | POA: Diagnosis not present

## 2024-05-08 DIAGNOSIS — F4323 Adjustment disorder with mixed anxiety and depressed mood: Secondary | ICD-10-CM | POA: Diagnosis not present

## 2024-05-08 NOTE — Progress Notes (Signed)
 IN PERSON   I connected with Jennifer Higgins on 05/08/24 at  10:50 AM EDT in person and verified that I am speaking with the correct person using two identifiers.   Location: Patient: office Provider: office   I discussed the limitations of evaluation and management by telemedicine and the availability of in person appointments. The patient expressed understanding and agreed to proceed. ( IN PERSON)    THERAPY PROGRESS NOTE   Session Time: 11:00 AM- 11:20 AM   Participation Level: Active   Behavioral Response: CasualAlert/Anxious   Type of Therapy: Individual Therapy   Treatment Goals addressed: Coping for Adjustment / Anxiety / Autism   Interventions: CBT, DBT, Solution Focused, Strength-based and Supportive   Summary: Jennifer Higgins is a 28 y.o. female who presents with Adjustment Disorder with mixed  mood and Anxiety, [name] social anxiety (Autism),. The OPT therapist worked with the patient for her OPT treatment session.The OPT therapist utilized Motivational Interviewing to assist in creating ongoing therapeutic repore. The patient gave insight and feedback about her triggers and symptoms over the past few weeks. The patient spoke about recent conflict with the live in room mate who accused the patient of being controlling for checking in on the roommate and asking when she would be home, this upset the caregiver who explained this was just her checking to make sure the roommate was ok and not a attempt to be controlling. The roommate situation happened earlier in the day and the patient messaged back to the roommate but has not yet received a response, however, hopes to be able to talk things out today. The patient spoke about  still wanting her independence and her feelings about because she has felt controlled that she would not try to be controlling over anyone else The OPT therapist worked with the patient allowing them to vent and validating her feelings while promoting ongoing  controlled emotion and communication with the roommate to resolve the conflict..The Opt therapist overviewed with the patient focus on her basic needs areas The patient spoke about looking forward to upcoming Thanksgiving  and Christmas holiday and celebrating with family going to local [employer] for family gathering and food. The OPT therapist overviewed with the patient her med response and reviewed the next upcoming scheduled me therapy follow up date with [facility] in Feb 2026.   Suicidal/Homicidal: Nowithout intent/plan   Therapist Response:The OPT therapist worked with the patient for the patients scheduled session. The patient was engaged in her session and gave feedback in relation to triggers, symptoms, and behavior responses over the past few weeks. The patient spoke about  recent conflict with the live in room mate who accused the patient of being controlling for checking in on the roommate and asking when she would be home, this upset the caregiver who explained this was just her checking to make sure the roommate was ok and not a attempt to be controlling. The roommate situation happened earlier in the day and the patient messaged back to the roommate but has not yet received a response, however, hopes to be able to talk things out today. The patient spoke about  still wanting her independence and her feelings about because she has felt controlled that she would not try to be controlling over anyone else The OPT therapist worked with the patient allowing them to vent and validating her feelings while promoting ongoing controlled emotion and communication with the roommate to resolve the conflict  The OPT therapist  worked with the patient utilizing an in session Cognitive Behavioral Therapy exercise. The patient was responsive in the session and verbalized,  I was not trying to be controlling I was just generally curious when she was going to be home and I ask things like that to  lots of people I was not trying to seem controlling. The OPT therapist worked with the patient on emotion control in future upcoming interactions with the roommate to allow for a change to resolve the concern. The patient spoke about enjoying video gaming, watching anime, and music being coping outlets. The OPT therapist worked with the patient encouraging, consistency with medication, use of coping strategy as well as communication and compromise with her guardian to allow independence. The patient will has been involved in a autism program and spoke about looking forward to giving a gift to another participant for the holiday. The patient spoke willingness to continue to give attention to her basic needs areas. The patient spoke about ongoing compliance in taking her prescribed medication. The OPT therapist overviewed with the patient her listed upcoming appointments from her MyChart.    Plan: Return again in 2/3 weeks.   Diagnosis:      Axis I:Social Anxiety, [name] Adjustment Disorder with mixed mood and anxiety (ASD)                           Axis II: No diagnosis       Collaboration of Care:Overview of patient involvement in the med therapy program and upcoming appointment with [facility] for psychiatry   Patient/Guardian was advised Release of Information must be obtained prior to any record release in order to collaborate their care with an outside provider. Patient/Guardian was advised if they have not already done so to contact the registration department to sign all necessary forms in order for us  to release information regarding their care.    Consent: Patient/Guardian gives verbal consent for treatment and assignment of benefits for services provided during this visit. Patient/Guardian expressed understanding and agreed to proceed.    I discussed the assessment and treatment plan with the patient. The patient was provided an opportunity to ask questions and all were answered. The  patient agreed with the plan and demonstrated an understanding of the instructions.   The patient was advised to call back or seek an in-person evaluation if the symptoms worsen or if the condition fails to improve as anticipated.   I provided 30 minutes of face-to-face time during this encounter.   Jerel Pepper, LCSW   05/08/2024

## 2024-05-23 ENCOUNTER — Other Ambulatory Visit: Payer: Self-pay | Admitting: Family Medicine

## 2024-05-23 DIAGNOSIS — E782 Mixed hyperlipidemia: Secondary | ICD-10-CM

## 2024-05-23 NOTE — Telephone Encounter (Signed)
 Copied from CRM #[id number]. Topic: Clinical - Medication Refill >> May 23, 2024 10:25 AM Farrel B wrote: Medication:  rosuvastatin  (CRESTOR ) 10 MG tablet pantoprazole  (PROTONIX ) 40 MG tablet gabapentin  (NEURONTIN ) 300 MG capsule Has the patient contacted their pharmacy? Yes Ms. Eleanor Clap DPR reached out and was advised to call and request    This is the patient's preferred pharmacy:  [facility] - [place], [place] - [address] [place] [address] Phone: [phone] Fax: [phone]  Is this the correct pharmacy for this prescription? Yes If no, delete pharmacy and type the correct one.   Has the prescription been filled recently? Yes  Is the patient out of the medication? Yes  Has the patient been seen for an appointment in the last year OR does the patient have an upcoming appointment? Yes  Can we respond through MyChart? Yes  Agent: Please be advised that Rx refills may take up to 3 business days. We ask that you follow-up with your pharmacy.

## 2024-06-12 ENCOUNTER — Ambulatory Visit (HOSPITAL_COMMUNITY): Admitting: Clinical

## 2024-06-12 DIAGNOSIS — F840 Autistic disorder: Secondary | ICD-10-CM

## 2024-06-12 DIAGNOSIS — F4310 Post-traumatic stress disorder, unspecified: Secondary | ICD-10-CM

## 2024-06-12 DIAGNOSIS — F4323 Adjustment disorder with mixed anxiety and depressed mood: Secondary | ICD-10-CM

## 2024-06-12 DIAGNOSIS — F4010 Social phobia, unspecified: Secondary | ICD-10-CM | POA: Diagnosis not present

## 2024-06-12 NOTE — Progress Notes (Signed)
 IN PERSON   I connected with Jennifer Higgins on 06/12/24 at  11:00 AM EDT in person and verified that I am speaking with the correct person using two identifiers.   Location: Patient: office Provider: office   I discussed the limitations of evaluation and management by telemedicine and the availability of in person appointments. The patient expressed understanding and agreed to proceed. ( IN PERSON)    THERAPY PROGRESS NOTE   Session Time: 11:00 AM- 11:30 AM   Participation Level: Active   Behavioral Response: CasualAlert/Anxious   Type of Therapy: Individual Therapy   Treatment Goals addressed: Coping for Adjustment / Anxiety / Autism   Interventions: CBT, DBT, Solution Focused, Strength-based and Supportive   Summary: Jennifer Higgins is a 29 y.o. female who presents with Adjustment Disorder with mixed  mood and Anxiety, [name] social anxiety (Autism),. The OPT therapist worked with the patient for her OPT treatment session.The OPT therapist utilized Motivational Interviewing to assist in creating ongoing therapeutic repore. The patient gave insight and feedback about her triggers and symptoms over the past few weeks. The patient spoke about recent becoming involved in a online based relationship with a guy from [place] . The patient spoke about  still wanting her independence and her feelings about being controlled by family, however, aknowledges she has not been doing great in showing that she can handle independent task over the last few weeks by not putting a lot of effort into chores she has done at home. The OPT therapist worked with the patient allowing them to vent and validating her feelings while promoting ongoing controlled emotion and communication within the home and promoting the patient put more focus and effort in to completing in home task to further validate her claim that she is able to live independently The Opt therapist overviewed with the patient focus on her basic  needs areas The patient spoke about her Christmas holiday and celebrating with family The patient spoke about her medication response and is doing well currently with her medication. The OPT therapist overviewed with the patient her med response and reviewed the next upcoming scheduled me therapy follow up date with [facility] in Feb 2026.   Suicidal/Homicidal: Nowithout intent/plan   Therapist Response:The OPT therapist worked with the patient for the patients scheduled session. The patient was engaged in her session and gave feedback in relation to triggers, symptoms, and behavior responses over the past few weeks. The patient spoke about recent becoming involved in a online based relationship with a guy from [place] . The patient spoke about  still wanting her independence and her feelings about being controlled by family, however, aknowledges she has not been doing great in showing that she can handle independent task over the last few weeks by not putting a lot of effort into chores she has done at home. The OPT therapist worked with the patient allowing them to vent and validating her feelings while promoting ongoing controlled emotion and communication within the home and promoting the patient put more focus and effort in to completing in home task to further validate her claim that she is able to live independently  The OPT therapist worked with the patient utilizing an in session Cognitive Behavioral Therapy exercise. The patient was responsive in the session and verbalized,  I want to be able to move out and eventually room share and live with my boyfriend and some friends. The OPT therapist worked with the patient on communication and compromise with her guardian  to allow independence. The patient whas been involved in a autism program and spoke about in Feb starting a class that teaches indepedant living skills. The patient spoke willingness to continue to give attention to her basic needs  areas. The patient spoke about ongoing compliance in taking her prescribed medication. The OPT therapist overviewed with the patient her listed upcoming appointments from her MyChart.    Plan: Return again in 2/3 weeks.   Diagnosis:      Axis I:Social Anxiety, [name] Adjustment Disorder with mixed mood and anxiety (ASD)                           Axis II: No diagnosis       Collaboration of Care:Overview of patient involvement in the med therapy program and upcoming appointment with [facility] for psychiatry   Patient/Guardian was advised Release of Information must be obtained prior to any record release in order to collaborate their care with an outside provider. Patient/Guardian was advised if they have not already done so to contact the registration department to sign all necessary forms in order for us  to release information regarding their care.    Consent: Patient/Guardian gives verbal consent for treatment and assignment of benefits for services provided during this visit. Patient/Guardian expressed understanding and agreed to proceed.    I discussed the assessment and treatment plan with the patient. The patient was provided an opportunity to ask questions and all were answered. The patient agreed with the plan and demonstrated an understanding of the instructions.   The patient was advised to call back or seek an in-person evaluation if the symptoms worsen or if the condition fails to improve as anticipated.   I provided 30 minutes of face-to-face time during this encounter.   Jerel Pepper, LCSW   06/12/2024

## 2024-07-17 ENCOUNTER — Ambulatory Visit (HOSPITAL_COMMUNITY): Admitting: Clinical

## 2024-07-21 ENCOUNTER — Ambulatory Visit (HOSPITAL_COMMUNITY): Admitting: Registered Nurse

## 2024-10-07 ENCOUNTER — Ambulatory Visit: Admitting: Neurology

## 2025-01-14 ENCOUNTER — Encounter: Admitting: Family Medicine

## 2025-01-15 ENCOUNTER — Encounter: Payer: Self-pay | Admitting: Family Medicine

## 2025-01-27 ENCOUNTER — Ambulatory Visit: Payer: Self-pay
